# Patient Record
Sex: Male | Born: 1955 | Race: White | Hispanic: No | Marital: Married | Smoking: Never smoker
Health system: Southern US, Community
[De-identification: ages and names within clinical notes are randomized; demographics above are authoritative.]

## PROBLEM LIST (undated history)

## (undated) DIAGNOSIS — M1990 Unspecified osteoarthritis, unspecified site: Secondary | ICD-10-CM

## (undated) DIAGNOSIS — I10 Essential (primary) hypertension: Secondary | ICD-10-CM

## (undated) DIAGNOSIS — M109 Gout, unspecified: Secondary | ICD-10-CM

## (undated) HISTORY — PX: HERNIA REPAIR: SHX51

## (undated) HISTORY — PX: COLONOSCOPY WITH PROPOFOL: SHX5780

---

## 2005-06-20 ENCOUNTER — Emergency Department: Payer: Self-pay | Admitting: Emergency Medicine

## 2005-07-07 ENCOUNTER — Emergency Department: Payer: Self-pay | Admitting: Emergency Medicine

## 2010-07-31 ENCOUNTER — Emergency Department: Payer: Self-pay | Admitting: Internal Medicine

## 2010-11-26 ENCOUNTER — Ambulatory Visit: Payer: Self-pay | Admitting: Family Medicine

## 2011-06-14 ENCOUNTER — Ambulatory Visit: Payer: Self-pay | Admitting: Surgery

## 2016-07-09 ENCOUNTER — Other Ambulatory Visit: Payer: Self-pay

## 2016-07-09 ENCOUNTER — Emergency Department: Payer: BC Managed Care – PPO

## 2016-07-09 ENCOUNTER — Emergency Department
Admission: EM | Admit: 2016-07-09 | Discharge: 2016-07-09 | Disposition: A | Discharge status: Home / Self Care | Payer: BC Managed Care – PPO | Attending: Emergency Medicine | Admitting: Emergency Medicine

## 2016-07-09 DIAGNOSIS — Y939 Activity, unspecified: Secondary | ICD-10-CM | POA: Insufficient documentation

## 2016-07-09 DIAGNOSIS — S51012A Laceration without foreign body of left elbow, initial encounter: Secondary | ICD-10-CM

## 2016-07-09 DIAGNOSIS — S0003XA Contusion of scalp, initial encounter: Secondary | ICD-10-CM | POA: Insufficient documentation

## 2016-07-09 DIAGNOSIS — Y999 Unspecified external cause status: Secondary | ICD-10-CM | POA: Diagnosis not present

## 2016-07-09 DIAGNOSIS — I10 Essential (primary) hypertension: Secondary | ICD-10-CM | POA: Diagnosis not present

## 2016-07-09 DIAGNOSIS — S52122A Displaced fracture of head of left radius, initial encounter for closed fracture: Secondary | ICD-10-CM | POA: Diagnosis not present

## 2016-07-09 DIAGNOSIS — S060X1A Concussion with loss of consciousness of 30 minutes or less, initial encounter: Secondary | ICD-10-CM | POA: Diagnosis not present

## 2016-07-09 DIAGNOSIS — S0990XA Unspecified injury of head, initial encounter: Secondary | ICD-10-CM | POA: Diagnosis present

## 2016-07-09 DIAGNOSIS — Z23 Encounter for immunization: Secondary | ICD-10-CM | POA: Insufficient documentation

## 2016-07-09 DIAGNOSIS — Y929 Unspecified place or not applicable: Secondary | ICD-10-CM | POA: Diagnosis not present

## 2016-07-09 DIAGNOSIS — W1809XA Striking against other object with subsequent fall, initial encounter: Secondary | ICD-10-CM | POA: Diagnosis not present

## 2016-07-09 DIAGNOSIS — W11XXXA Fall on and from ladder, initial encounter: Secondary | ICD-10-CM | POA: Diagnosis not present

## 2016-07-09 DIAGNOSIS — W19XXXA Unspecified fall, initial encounter: Secondary | ICD-10-CM

## 2016-07-09 HISTORY — DX: Essential (primary) hypertension: I10

## 2016-07-09 MED ORDER — LIDOCAINE-EPINEPHRINE 2 %-1:100000 IJ SOLN
20.0000 mL | Freq: Once | INTRAMUSCULAR | Status: DC
Start: 2016-07-09 — End: 2016-07-09
  Filled 2016-07-09: qty 20

## 2016-07-09 MED ORDER — LIDOCAINE-EPINEPHRINE (PF) 1 %-1:200000 IJ SOLN
10.0000 mL | Freq: Once | INTRAMUSCULAR | Status: AC
  Administered 2016-07-09: 10 mL via INTRADERMAL

## 2016-07-09 MED ORDER — LIDOCAINE-EPINEPHRINE (PF) 1 %-1:200000 IJ SOLN
INTRAMUSCULAR | Status: AC
  Administered 2016-07-09: 10 mL via INTRADERMAL
  Filled 2016-07-09: qty 30

## 2016-07-09 MED ORDER — LIDOCAINE HCL (PF) 1 % IJ SOLN
INTRAMUSCULAR | Status: AC
  Filled 2016-07-09: qty 5

## 2016-07-09 MED ORDER — TETANUS-DIPHTH-ACELL PERTUSSIS 5-2.5-18.5 LF-MCG/0.5 IM SUSP
0.5000 mL | Freq: Once | INTRAMUSCULAR | Status: AC
  Administered 2016-07-09: 0.5 mL via INTRAMUSCULAR
  Filled 2016-07-09: qty 0.5

## 2016-07-09 NOTE — ED Provider Notes (Signed)
[facility] Department Provider Note  ____________________________________________  Time seen: Approximately 1:23 PM  I have reviewed the triage vital signs and the nursing notes.   HISTORY  Chief Complaint Fall and Head Injury   HPI Benjamin Parker is a 60 y.o. male with no significant past medical history who presents for evaluation of head injury status post mechanical fall. Patient was 5 feet high on the ladder when his foot got caught and fell backwards. He reports that he hit his head in the occipital region on the concrete. Positive LOC. He does not take any blood thinners. Patient was ambulatory at the scene. Complaining of mild pain in the back of his head. He also hit his left elbow on the concrete and is complaining of moderate pain in the left elbow, worse with movement, present since the fall, nonradiating. Patient denies any other injuries. He was brought in by his son. C-collar was placed in triage.  Past Medical History:  Diagnosis Date  . Hypertension     There are no active problems to display for this patient.   Past Surgical History:  Procedure Laterality Date  . HERNIA REPAIR        Allergies Review of patient's allergies indicates no known allergies.  No family history on file.  Social History Social History  Substance Use Topics  . Smoking status: Never Smoker  . Smokeless tobacco: Never Used  . Alcohol use No    Review of Systems Constitutional: Negative for fever. Eyes: Negative for visual changes. ENT: + head injury Cardiovascular: Negative for chest injury. Respiratory: Negative for shortness of breath. Negative for chest wall injury. Gastrointestinal: Negative for abdominal pain or injury. Genitourinary: Negative for dysuria. Musculoskeletal: Negative for back injury, + left elbow injury Skin: Negative for laceration/abrasions. Neurological: Negative for head  injury.   ____________________________________________   PHYSICAL EXAM:  VITAL SIGNS: ED Triage Vitals  Enc Vitals Group     BP 07/09/16 1151 (!) 179/83     Pulse Rate 07/09/16 1151 (!) 106     Resp 07/09/16 1151 18     Temp 07/09/16 1151 98 F (36.7 C)     Temp Source 07/09/16 1151 Oral     SpO2 07/09/16 1151 97 %     Weight 07/09/16 1151 200 lb (90.7 kg)     Height 07/09/16 1151 5\' 10"  (1.778 m)     Head Circumference --      Peak Flow --      Pain Score 07/09/16 1152 4     Pain Loc --      Pain Edu? --      Excl. in [place]? --     Constitutional: Alert and oriented. No acute distress. Does not appear intoxicated. HEENT Head: Normocephalic and occipital hematoma. Face: No facial bony tenderness. Stable midface Ears: No hemotympanum bilaterally.No hemotympanum no Battle's sign Eyes: No eye injury. PERRL. No raccoon eyes Nose: Nontender. No epistaxis. Mouth/Throat: Mucous membranes are moist. No oropharyngeal blood. No dental injury. Airway patent without stridor. Normal voice. Neck: C-collar in place. No midline c-spine tenderness.  Cardiovascular: Normal rate, regular rhythm. Normal and symmetric distal pulses are present in all extremities. Pulmonary/Chest: Chest wall is stable and nontender to palpation/compression. Normal respiratory effort. Breath sounds are normal. No crepitus.  Abdominal: Soft, nontender, non distended. Musculoskeletal: 2 cm shallow laceration, abrasions and ttp over the L elbow. Nontender with normal full range of motion in all other extremities. No deformities. No thoracic  or lumbar midline spinal tenderness. Pelvis is stable. Skin: Skin is warm, dry and intact. No abrasions or contutions. Psychiatric: Speech and behavior are appropriate. Neurological: Normal speech and language. Moves all extremities to command. No gross focal neurologic deficits are appreciated.  Glascow Coma Score: 4 - Opens eyes on own 6 - Follows simple motor commands 5 -  Alert and oriented GCS: 15   ____________________________________________   LABS (all labs ordered are listed, but only abnormal results are displayed)  Labs Reviewed - No data to display ____________________________________________  EKG  none  ____________________________________________  RADIOLOGY  Head CT: negative CT cervical spine: negative  Elbow XR:  Pre olecranon soft tissue swelling with tiny radiopaque foreign Objects. Osseous irregularity about the radial head, favored to be degenerative, given absence of joint effusion. No well-defined fracture line identified. ____________________________________________   PROCEDURES  Procedure(s) performed:yes  LACERATION REPAIR Performed by: Rudene Re Authorized by: Rudene Re Consent: Verbal consent obtained. Risks and benefits: risks, benefits and alternatives were discussed Consent given by: patient Patient identity confirmed: provided demographic data Prepped and Draped in normal sterile fashion Wound explored  Laceration Location: Left elbow   Laceration Length: 2.5cm  No Foreign Bodies seen or palpated  Anesthesia: local infiltration  Local anesthetic: lidocaine 1% w/ epinephrine  Anesthetic total: 2 ml  Irrigation method: syringe Amount of cleaning: standard  Skin closure: 4.0 prolene  Number of sutures: 3  Technique: simple interrupted  Patient tolerance: Patient tolerated the procedure well with no immediate complications.    SPLINT APPLICATION Date/Time: [id number] PM Authorized by: Rudene Re Consent: Verbal consent obtained. Risks and benefits: risks, benefits and alternatives were discussed Consent given by: patient Splint applied by: orthopedic technician Location details: L forearm Splint type: sugar tongue Supplies used: fiberglass and ACE Post-procedure: The splinted body part was neurovascularly unchanged following the procedure. Patient tolerance: Patient  tolerated the procedure well with no immediate complications.     Critical Care performed:  None ____________________________________________   INITIAL IMPRESSION / ASSESSMENT AND PLAN / ED COURSE  60 year old male with no significant past medical history who presents after falling 5 feet from a ladder with positive LOC. Patient found to have a hematoma on the occipital region, he is neurologically intact, patient also has abrasion and a small superficial laceration to the left elbow with swelling and tenderness. Patient is on a c-collar. Plan for CT head, CT cervical spine, x-ray of the elbow, lac repair, tetanus booster  Clinical Course   _________________________ 3:04 PM on 07/09/2016 -----------------------------------------  Head CT and CT cervical spine negative. X-ray showing an osseous irregularity on the radial head, patient is tender to palpation there. Therefore splint was placed and patient will follow-up with ortho. Laceration repair. XR also showing foreign body, no foreign body seen on eval, patient had no laceration and skin was intact over the area of the foreign body. Tetanus shot Was given. Patient will f/u with Dr. Roland Rack in 1 week.   Pertinent labs & imaging results that were available during my care of the patient were reviewed by me and considered in my medical decision making (see chart for details).   I discussed my evaluation of the patient's symptoms, my clinical impression, and my proposed outpatient treatment plan with patient/ family members. We have discussed anticipatory guidance, scheduled follow-up, and careful return precautions. The patient expresses understanding and is comfortable with the discharge plan. All patient's questions were answered.    ____________________________________________   FINAL CLINICAL IMPRESSION(S) / ED DIAGNOSES  Final diagnoses:  Fall, initial encounter  Left radial head fracture, closed, initial encounter  Concussion,  with loss of consciousness of 30 minutes or less, initial encounter  Elbow laceration, left, initial encounter  Scalp hematoma, initial encounter      NEW MEDICATIONS STARTED DURING THIS VISIT:  New Prescriptions   No medications on file     Note:  This document was prepared using Dragon voice recognition software and may include unintentional dictation errors.    Rudene Re, MD 07/09/16 1520

## 2016-07-09 NOTE — ED Notes (Signed)
MD at bedside for suture placement. Pt in NAD.

## 2016-07-09 NOTE — ED Triage Notes (Signed)
Pt states he was approximately 46ft up on the ladder and slipped and fell back hitting his head on brick knocking him unconscious , states when he woke up it was not able to move for a minute or 2, felt paralyzed.. Pt has large hematoma to the back of the head and is c/o pain to the left elbow., c-collar applied.Marland Kitchen

## 2016-07-09 NOTE — Discharge Instructions (Signed)
Follow up with ortho in 1 week. Keep splint dry and clean. Watch laceration for signs of infection including fever, pus, or redness of the skin. If those develop, pls return to the ER.

## 2017-04-02 ENCOUNTER — Encounter: Payer: Self-pay | Admitting: Podiatry

## 2017-04-02 ENCOUNTER — Ambulatory Visit (INDEPENDENT_AMBULATORY_CARE_PROVIDER_SITE_OTHER): Payer: BC Managed Care – PPO | Admitting: Podiatry

## 2017-04-02 DIAGNOSIS — M722 Plantar fascial fibromatosis: Secondary | ICD-10-CM | POA: Diagnosis not present

## 2017-04-02 MED ORDER — MELOXICAM 15 MG PO TABS: 15.0000 mg | ORAL_TABLET | Freq: Every day | ORAL | 3 refills | Status: DC

## 2017-04-02 MED ORDER — METHYLPREDNISOLONE 4 MG PO TBPK: ORAL_TABLET | ORAL | 0 refills | Status: DC

## 2017-04-02 NOTE — Progress Notes (Signed)
   Subjective:    Patient ID: Benjamin Parker, male    DOB: 1956/09/14, 61 y.o.   MRN: [id number]  HPI: He presents is that he is a 60 year old white male with chief complaint of bilateral heel pain and dorsum of the foot pain. States is been going on now right worse than left or the past several months. States that he's tried new shoes and boots to no avail.    Review of Systems  All other systems reviewed and are negative.      Objective:   Physical Exam: Vital signs are stable alert and oriented 3. Pulses are palpable. Neurological sensory is intact. Degenerative flexors are intact. Muscle strength is normal. Orthopedic evaluation demonstrates all joints distal ankle range of motion without crepitation. He has pain on palpation medial calcaneal tubercle bilateral and pain on palpation of the deep peroneal nerve to the dorsal aspect of the right foot over the left. Radiographs were not taken today.        Assessment & Plan:  Assessment plantar fasciitis bilateral right greater than left deep peroneal nerve neuritis.  Plan: Discussed etiology pathology conservative versus surgical therapies. Started him on a Medrol Dosepak to be followed by meloxicam. Injected bilateral heels. Placed him in bilateral plantar fascia braces and single night splint. Discussed appropriate shoe gear stretching exercises ice therapy and shoe modification. Follow-up with me in 1 month

## 2017-04-02 NOTE — Patient Instructions (Signed)

## 2017-04-30 ENCOUNTER — Encounter: Payer: Self-pay | Admitting: Podiatry

## 2017-04-30 ENCOUNTER — Ambulatory Visit: Payer: BC Managed Care – PPO | Admitting: Podiatry

## 2017-04-30 ENCOUNTER — Ambulatory Visit (INDEPENDENT_AMBULATORY_CARE_PROVIDER_SITE_OTHER): Payer: BC Managed Care – PPO | Admitting: Podiatry

## 2017-04-30 DIAGNOSIS — M722 Plantar fascial fibromatosis: Secondary | ICD-10-CM

## 2017-04-30 NOTE — Progress Notes (Signed)
Benjamin Parker presents today states that he is 90-95% better as he refers and plantar fasciitis bilateral foot.  Objective: Vital signs are stable he is alert and oriented 3. Mild tender to palpation medial tubercle of the left heel.  Assessment: Well-healing plantar fasciitis 95% resolved.  Plan: I highly recommend he continue all conservative therapies for at least another month if this should fail restart regresses and fossa not immediately for another injection as well as orthotics.

## 2017-05-23 ENCOUNTER — Other Ambulatory Visit: Payer: Self-pay

## 2017-05-23 ENCOUNTER — Observation Stay
Admission: EM | Admit: 2017-05-23 | Discharge: 2017-05-24 | Disposition: A | Discharge status: Home / Self Care | Payer: BC Managed Care – PPO | Attending: Internal Medicine | Admitting: Internal Medicine

## 2017-05-23 ENCOUNTER — Emergency Department: Payer: BC Managed Care – PPO

## 2017-05-23 DIAGNOSIS — E860 Dehydration: Secondary | ICD-10-CM | POA: Insufficient documentation

## 2017-05-23 DIAGNOSIS — H539 Unspecified visual disturbance: Secondary | ICD-10-CM | POA: Diagnosis present

## 2017-05-23 DIAGNOSIS — I160 Hypertensive urgency: Secondary | ICD-10-CM

## 2017-05-23 DIAGNOSIS — Z79899 Other long term (current) drug therapy: Secondary | ICD-10-CM | POA: Diagnosis not present

## 2017-05-23 DIAGNOSIS — G459 Transient cerebral ischemic attack, unspecified: Secondary | ICD-10-CM

## 2017-05-23 DIAGNOSIS — N179 Acute kidney failure, unspecified: Secondary | ICD-10-CM | POA: Diagnosis not present

## 2017-05-23 DIAGNOSIS — G43B0 Ophthalmoplegic migraine, not intractable: Principal | ICD-10-CM | POA: Insufficient documentation

## 2017-05-23 DIAGNOSIS — Z791 Long term (current) use of non-steroidal anti-inflammatories (NSAID): Secondary | ICD-10-CM | POA: Diagnosis not present

## 2017-05-23 DIAGNOSIS — M542 Cervicalgia: Secondary | ICD-10-CM | POA: Insufficient documentation

## 2017-05-23 DIAGNOSIS — I10 Essential (primary) hypertension: Secondary | ICD-10-CM | POA: Insufficient documentation

## 2017-05-23 DIAGNOSIS — R413 Other amnesia: Secondary | ICD-10-CM

## 2017-05-23 LAB — URINALYSIS, COMPLETE (UACMP) WITH MICROSCOPIC
Bacteria, UA: NONE SEEN
Bilirubin Urine: NEGATIVE
GLUCOSE, UA: 50 mg/dL — AB
Hgb urine dipstick: NEGATIVE
KETONES UR: NEGATIVE mg/dL
Leukocytes, UA: NEGATIVE
Nitrite: NEGATIVE
PH: 7 (ref 5.0–8.0)
Protein, ur: NEGATIVE mg/dL
SPECIFIC GRAVITY, URINE: 1.008 (ref 1.005–1.030)
SQUAMOUS EPITHELIAL / LPF: NONE SEEN

## 2017-05-23 LAB — CBC
HEMATOCRIT: 39.7 % — AB (ref 40.0–52.0)
Hemoglobin: 13.6 g/dL (ref 13.0–18.0)
MCH: 29.2 pg (ref 26.0–34.0)
MCHC: 34.3 g/dL (ref 32.0–36.0)
MCV: 85 fL (ref 80.0–100.0)
Platelets: 223 10*3/uL (ref 150–440)
RBC: 4.67 MIL/uL (ref 4.40–5.90)
RDW: 13.3 % (ref 11.5–14.5)
WBC: 6.8 10*3/uL (ref 3.8–10.6)

## 2017-05-23 LAB — COMPREHENSIVE METABOLIC PANEL
ALT: 15 U/L — ABNORMAL LOW (ref 17–63)
AST: 24 U/L (ref 15–41)
Albumin: 4.2 g/dL (ref 3.5–5.0)
Alkaline Phosphatase: 52 U/L (ref 38–126)
Anion gap: 6 (ref 5–15)
BILIRUBIN TOTAL: 0.8 mg/dL (ref 0.3–1.2)
BUN: 14 mg/dL (ref 6–20)
CHLORIDE: 102 mmol/L (ref 101–111)
CO2: 28 mmol/L (ref 22–32)
CREATININE: 1.41 mg/dL — AB (ref 0.61–1.24)
Calcium: 8.7 mg/dL — ABNORMAL LOW (ref 8.9–10.3)
GFR, EST NON AFRICAN AMERICAN: 52 mL/min — AB (ref >60)
Glucose, Bld: 143 mg/dL — ABNORMAL HIGH (ref 65–99)
POTASSIUM: 3.8 mmol/L (ref 3.5–5.1)
Sodium: 136 mmol/L (ref 135–145)
TOTAL PROTEIN: 7.3 g/dL (ref 6.5–8.1)

## 2017-05-23 LAB — TROPONIN I

## 2017-05-23 MED ORDER — ASPIRIN 81 MG PO CHEW
324.0000 mg | CHEWABLE_TABLET | Freq: Once | ORAL | Status: AC
  Administered 2017-05-23: 324 mg via ORAL
  Filled 2017-05-23: qty 4

## 2017-05-23 MED ORDER — LABETALOL HCL 5 MG/ML IV SOLN
10.0000 mg | Freq: Once | INTRAVENOUS | Status: AC
  Administered 2017-05-23: 10 mg via INTRAVENOUS
  Filled 2017-05-23: qty 4

## 2017-05-23 NOTE — H&P (Signed)
[place] @ [facility] Admission History and Physical Harvie Bridge, D.O.  ---------------------------------------------------------------------------------------------------------------------   PATIENT NAME: Benjamin Parker MR#: [id number] DATE OF BIRTH: [birth year] DATE OF ADMISSION: 05/23/2017 PRIMARY CARE PHYSICIAN: Albina Billet, MD  REQUESTING/REFERRING PHYSICIAN: ED Dr. Jimmye Norman  CHIEF COMPLAINT: Chief Complaint  Patient presents with  . Loss of Vision  . Neck Pain  . Memory Loss    HISTORY OF PRESENT ILLNESS: Benjamin Parker is a 61 y.o. male with a known history of HTN was in a usual state of health until today around 6:30PM when he had sudden onset of memory loss where he could not remember people's names. He also had vision changes including flashing lights and floaters.  His symptoms have fully resolved. He states that he has had a minor cough recently as well as chronic allergic eye irritation.  Otherwise there has been no change in status. Patient has been taking medication as prescribed and there has been no recent change in medication or diet.  There has been no recent illness, travel or sick contacts.    Patient denies fevers/chills, weakness, dizziness, chest pain, shortness of breath, N/V/C/D, abdominal pain, dysuria/frequency, changes in mental status.   EMS/ED COURSE:  Patient received aspirin 324 and labetalol 10 mg IV. He was seen by telemetry neurology and medical admission was requested for further workup of transient ischemic attack.   PAST MEDICAL HISTORY: Past Medical History:  Diagnosis Date  . Hypertension   Seasonal allergies    PAST SURGICAL HISTORY: Past Surgical History:  Procedure Laterality Date  . HERNIA REPAIR        SOCIAL HISTORY: Social History  Substance Use Topics  . Smoking status: Never Smoker  . Smokeless tobacco: Never Used  . Alcohol use No  Denies drug, alcohol tobacco use. Works as a Games developer.   Family  History   Medical History Relation Name Comments  Pancreatic cancer Father    Other Mother  Brain Tumor      MEDICATIONS AT HOME: Prior to Admission medications   Medication Sig Start Date End Date Taking? Authorizing Provider  lisinopril (PRINIVIL,ZESTRIL) 10 MG tablet  02/14/17  Yes [provider]  meloxicam (MOBIC) 15 MG tablet Take 1 tablet (15 mg total) by mouth daily. 04/02/17  Yes Hyatt, Max T, DPM      DRUG ALLERGIES: No Known Allergies   REVIEW OF SYSTEMS: CONSTITUTIONAL: No fever/chills, fatigue, weakness, weight gain/loss, headache EYES: Positive blurry or double vision, flashing lights, floaters. ENT: No tinnitus, postnasal drip, redness or soreness of the oropharynx. RESPIRATORY: No cough, wheeze, hemoptysis, dyspnea. CARDIOVASCULAR: No chest pain, orthopnea, palpitations, syncope. GASTROINTESTINAL: No nausea, vomiting, constipation, diarrhea, abdominal pain, hematemesis, melena or hematochezia. GENITOURINARY: No dysuria or hematuria. ENDOCRINE: No polyuria or nocturia. No heat or cold intolerance. HEMATOLOGY: No anemia, bruising, bleeding. INTEGUMENTARY: No rashes, ulcers, lesions. MUSCULOSKELETAL: No arthritis, swelling, gout. NEUROLOGIC: Positive memory loss. No numbness, tingling, weakness or ataxia. No seizure-type activity. PSYCHIATRIC: No anxiety, depression, insomnia.  PHYSICAL EXAMINATION: VITAL SIGNS: Blood pressure (!) 214/92, pulse 69, temperature 97.7 F (36.5 C), temperature source Oral, resp. rate 16, height 5\' 10"  (1.778 m), weight 94.8 kg (209 lb), SpO2 97 %.  GENERAL: 61 y.o.-year-old male patient, well-developed, well-nourished lying in the bed in no acute distress.  Pleasant and cooperative.   HEENT: Head atraumatic, normocephalic. Pupils equal, round, reactive to light and accommodation.Injected sclerae bilaterally Extraocular muscles intact. Nares are patent. Oropharynx is clear. Mucus membranes moist. NECK: Supple, full range of  motion. No JVD, no bruit heard. No thyroid enlargement, no tenderness, no cervical lymphadenopathy. CHEST: Normal breath sounds bilaterally. No wheezing, rales, rhonchi or crackles. No use of accessory muscles of respiration.  No reproducible chest wall tenderness.  CARDIOVASCULAR: S1, S2 normal. No murmurs, rubs, or gallops. Cap refill <2 seconds. ABDOMEN: Soft, nontender, nondistended. No rebound, guarding, rigidity. Normoactive bowel sounds present in all four quadrants. No organomegaly or mass. EXTREMITIES: Full range of motion. No pedal edema, cyanosis, or clubbing. NEUROLOGIC: Cranial nerves II through XII are grossly intact with no focal sensorimotor deficit. Muscle strength 5/5 in all extremities. Sensation intact. Gait not checked. Cerebellar signs intact PSYCHIATRIC: The patient is alert and oriented x 3. Normal affect, mood, thought content. SKIN: Warm, dry, and intact without obvious rash, lesion, or ulcer.  LABORATORY PANEL:  CBC  Recent Labs Lab 05/23/17 1952  WBC 6.8  HGB 13.6  HCT 39.7*  PLT 223   ----------------------------------------------------------------------------------------------------------------- Chemistries  Recent Labs Lab 05/23/17 1952  NA 136  K 3.8  CL 102  CO2 28  GLUCOSE 143*  BUN 14  CREATININE 1.41*  CALCIUM 8.7*  AST 24  ALT 15*  ALKPHOS 52  BILITOT 0.8   ------------------------------------------------------------------------------------------------------------------ Cardiac Enzymes  Recent Labs Lab 05/23/17 1952  TROPONINI <0.03   ------------------------------------------------------------------------------------------------------------------  RADIOLOGY: Ct Head Wo Contrast  Result Date: 05/23/2017 CLINICAL DATA:  Blind spots, vision changes EXAM: CT HEAD WITHOUT CONTRAST TECHNIQUE: Contiguous axial images were obtained from the base of the skull through the vertex without intravenous contrast. COMPARISON:  07/09/2016  FINDINGS: Brain: No evidence of acute infarction, hemorrhage, hydrocephalus, extra-axial collection or mass lesion/mass effect. Vascular: No hyperdense vessel or unexpected calcification. Skull: Normal. Negative for fracture or focal lesion. Sinuses/Orbits: Mild mucosal thickening in the ethmoid sinuses. No acute orbital abnormality. Other: None IMPRESSION: No CT evidence for acute intracranial abnormality. Electronically Signed   By: Donavan Foil M.D.   On: 05/23/2017 20:15    EKG: Normal sinus rhythm at 70 bpm, normal axis, nonspecific ST and T wave changes.  IMPRESSION AND PLAN:  This is a 61 y.o. male with a history of hypertension now being admitted with:  1. TIA rule out CVA -  - Admit telemetry observation for neuro workup including: - Studies: MRA/MRI, Echo, Carotids - Labs: CBC, BMP, Lipids, TFTs, A1C - Nursing: Neurochecks, O2, dysphagia screen, permissive hypertension (hold lisinopril). - Consults: Neurology, PT/OT, S/S consults.  - Meds: Daily aspirin 81mg .   - Fluids: IVNS@75cc /hr.   -   2. Renal insufficiency unclear if acute or chronic -Gentle IV fluid hydration -Repeat BMP in a.m. -Hold lisinopril and low back  Admission status: Observation, telemetry IV fluids: Normal saline Diet: Nothing by mouth pending swallowing eval Routine DVT Px: with Lovenox, SCDs, early ambulation Consults: Neurology Code Status: Full Disposition plan: To home in less than 24 hours  All the records are reviewed and case discussed with ED provider. Management plans discussed with the patient and/or family who express understanding and agree with plan of care.   TOTAL TIME TAKING CARE OF THIS PATIENT: 60 minutes.   Orvilla Truett D.O. on 05/23/2017 at 11:31 PM Between 7am to 6pm - Pager - [phone] After 6pm go to [email or web address] - [employer]reader [facility] Office [phone] CC: Primary care physician; Albina Billet, MD     Note: This  dictation was prepared with Dragon dictation along with smaller phrase technology. Any transcriptional errors that result from this process are unintentional.

## 2017-05-23 NOTE — ED Provider Notes (Signed)
[facility] Emergency Department Provider Note       Time seen: ----------------------------------------- 10:03 PM on 05/23/2017 -----------------------------------------     I have reviewed the triage vital signs and the nursing notes.   HISTORY   Chief Complaint Loss of Vision; Neck Pain; and Memory Loss    HPI Benjamin Parker is a 61 y.o. male who presents to the ED for episodes of blind spots and flashes on his vision that occurred an hour and a half ago. Patient states he had an episode today where he couldn't remember names. He reports soreness and tightness to both shoulders and his neck. He denies any trouble speaking or swallowing. Similar symptoms have occurred on Monday or Tuesday but have since resolved. He denies any other neurologic complaints or symptoms at this time.   Past Medical History:  Diagnosis Date  . Hypertension     There are no active problems to display for this patient.   Past Surgical History:  Procedure Laterality Date  . HERNIA REPAIR      Allergies Patient has no known allergies.  Social History Social History  Substance Use Topics  . Smoking status: Never Smoker  . Smokeless tobacco: Never Used  . Alcohol use No    Review of Systems Constitutional: Negative for fever. Eyes: Positive for vision changes ENT:  Negative for congestion, sore throat Cardiovascular: Negative for chest pain. Respiratory: Negative for shortness of breath. Gastrointestinal: Negative for abdominal pain, vomiting and diarrhea. Genitourinary: Negative for dysuria. Musculoskeletal: Negative for back pain. Skin: Negative for rash. Neurological: Negative for headaches, focal weakness or numbness. Positive for difficulty with memory recall  All systems negative/normal/unremarkable except as stated in the HPI  ____________________________________________   PHYSICAL EXAM:  VITAL SIGNS: ED Triage Vitals  Enc Vitals Group      BP 05/23/17 1949 (!) 197/94     Pulse Rate 05/23/17 1949 93     Resp 05/23/17 1949 18     Temp 05/23/17 1949 97.7 F (36.5 C)     Temp Source 05/23/17 1949 Oral     SpO2 05/23/17 1949 98 %     Weight 05/23/17 1944 209 lb (94.8 kg)     Height 05/23/17 1944 5\' 10"  (1.778 m)     Head Circumference --      Peak Flow --      Pain Score 05/23/17 1944 5     Pain Loc --      Pain Edu? --      Excl. in [place]? --    Constitutional: Alert and oriented. Well appearing and in no distress. Eyes: Conjunctivae are normal. Normal extraocular movements.Funduscopic exam reveals normal optic discs bilaterally ENT   Head: Normocephalic and atraumatic.   Nose: No congestion/rhinnorhea.   Mouth/Throat: Mucous membranes are moist.   Neck: No stridor. Cardiovascular: Normal rate, regular rhythm. No murmurs, rubs, or gallops. Respiratory: Normal respiratory effort without tachypnea nor retractions. Breath sounds are clear and equal bilaterally. No wheezes/rales/rhonchi. Gastrointestinal: Soft and nontender. Normal bowel sounds Musculoskeletal: Nontender with normal range of motion in extremities. No lower extremity tenderness nor edema. Neurologic:  Normal speech and language. No gross focal neurologic deficits are appreciated.  Skin:  Skin is warm, dry and intact. No rash noted. Psychiatric: Mood and affect are normal. Speech and behavior are normal.  ____________________________________________  EKG: Interpreted by me.Sinus rhythm rate of 70 bpm, normal PR interval, normal QRS, normal QT.  ____________________________________________  ED COURSE:  Pertinent labs & imaging  results that were available during my care of the patient were reviewed by me and considered in my medical decision making (see chart for details). Patient presents for seeing flashes and floaters as well as transient memory recall problems, we will assess with labs and imaging as indicated.    Procedures ____________________________________________   LABS (pertinent positives/negatives)  Labs Reviewed  CBC - Abnormal; Notable for the following:       Result Value   HCT 39.7 (*)    All other components within normal limits  COMPREHENSIVE METABOLIC PANEL - Abnormal; Notable for the following:    Glucose, Bld 143 (*)    Creatinine, Ser 1.41 (*)    Calcium 8.7 (*)    ALT 15 (*)    GFR calc non Af Amer 52 (*)    All other components within normal limits  URINALYSIS, COMPLETE (UACMP) WITH MICROSCOPIC - Abnormal; Notable for the following:    Color, Urine STRAW (*)    APPearance CLEAR (*)    Glucose, UA 50 (*)    All other components within normal limits  TROPONIN I    RADIOLOGY CT head  IMPRESSION: No CT evidence for acute intracranial abnormality.   ____________________________________________  FINAL ASSESSMENT AND PLAN  Visual disturbance, memory disturbance, hypertension  Plan: Patient's labs and imaging were dictated above. Patient had presented for seeing flashes and floaters and having trouble recalling names. Otherwise his examination is normal in all the symptoms have subsided. We will consult neurology for further evaluation.  Patient was discussed with patella neurology on call recommends admission with possible MRI and blood pressure control. We have given him aspirin, I will start him on labetalol and talk with the hospitalist for admission. Earleen Newport, MD   Note: This note was generated in part or whole with voice recognition software. Voice recognition is usually quite accurate but there are transcription errors that can and very often do occur. I apologize for any typographical errors that were not detected and corrected.     Earleen Newport, MD 05/23/17 [phone]

## 2017-05-23 NOTE — ED Triage Notes (Addendum)
Pt presents to ED with c/o episodes of "blind spots" and flashes on his vision that occurred 1.5 hrs PTA. Pt also states that he had an episode today where he "couldn't remember names". Pt reports soreness and tightness to bilateral shoulders and neck. Pt denies trouble speaking or swallowing, no facial droop, no c/o unilateral numbness or weakness in the extremities. Pt reports similar s/x's occurred on "Monday or Tuesday" that have since resolved. Pt is HYPERTENSIVE in Triage (BP 197/94); reports compliance with BP meds.

## 2017-05-24 ENCOUNTER — Observation Stay (HOSPITAL_COMMUNITY): Payer: BC Managed Care – PPO

## 2017-05-24 ENCOUNTER — Observation Stay: Payer: BC Managed Care – PPO

## 2017-05-24 ENCOUNTER — Observation Stay
Admit: 2017-05-24 | Discharge: 2017-05-24 | Disposition: A | Discharge status: Home / Self Care | Payer: BC Managed Care – PPO | Attending: Family Medicine | Admitting: Family Medicine

## 2017-05-24 DIAGNOSIS — G43109 Migraine with aura, not intractable, without status migrainosus: Secondary | ICD-10-CM | POA: Diagnosis not present

## 2017-05-24 LAB — LIPID PANEL
CHOLESTEROL: 204 mg/dL — AB (ref 0–200)
HDL: 29 mg/dL — ABNORMAL LOW (ref >40)
LDL Cholesterol: 150 mg/dL — ABNORMAL HIGH (ref 0–99)
Total CHOL/HDL Ratio: 7 RATIO
Triglycerides: 127 mg/dL (ref <150)
VLDL: 25 mg/dL (ref 0–40)

## 2017-05-24 LAB — URINE DRUG SCREEN, QUALITATIVE (ARMC ONLY)
AMPHETAMINES, UR SCREEN: NOT DETECTED
BARBITURATES, UR SCREEN: NOT DETECTED
BENZODIAZEPINE, UR SCRN: NOT DETECTED
Cannabinoid 50 Ng, Ur ~~LOC~~: NOT DETECTED
Cocaine Metabolite,Ur ~~LOC~~: NOT DETECTED
MDMA (Ecstasy)Ur Screen: NOT DETECTED
Methadone Scn, Ur: NOT DETECTED
OPIATE, UR SCREEN: NOT DETECTED
PHENCYCLIDINE (PCP) UR S: NOT DETECTED
Tricyclic, Ur Screen: NOT DETECTED

## 2017-05-24 LAB — ECHOCARDIOGRAM COMPLETE
HEIGHTINCHES: 70 in
WEIGHTICAEL: 3344 [oz_av]

## 2017-05-24 LAB — TROPONIN I

## 2017-05-24 MED ORDER — STROKE: EARLY STAGES OF RECOVERY BOOK
Freq: Once | Status: AC
  Administered 2017-05-24: 01:00:00

## 2017-05-24 MED ORDER — ACETAMINOPHEN 650 MG RE SUPP: 650.0000 mg | RECTAL | Status: DC | PRN

## 2017-05-24 MED ORDER — ACETAMINOPHEN 325 MG PO TABS: 650.0000 mg | ORAL_TABLET | ORAL | Status: DC | PRN

## 2017-05-24 MED ORDER — ASPIRIN EC 81 MG PO TBEC
81.0000 mg | DELAYED_RELEASE_TABLET | Freq: Every day | ORAL | Status: DC
  Administered 2017-05-24: 81 mg via ORAL
  Filled 2017-05-24: qty 1

## 2017-05-24 MED ORDER — ASPIRIN 81 MG PO TBEC: 81.0000 mg | DELAYED_RELEASE_TABLET | Freq: Every day | ORAL | 0 refills | Status: AC

## 2017-05-24 MED ORDER — SENNOSIDES-DOCUSATE SODIUM 8.6-50 MG PO TABS: 1.0000 | ORAL_TABLET | Freq: Every evening | ORAL | Status: DC | PRN

## 2017-05-24 MED ORDER — ENOXAPARIN SODIUM 40 MG/0.4ML ~~LOC~~ SOLN
40.0000 mg | SUBCUTANEOUS | Status: DC
  Filled 2017-05-24: qty 0.4

## 2017-05-24 MED ORDER — ACETAMINOPHEN 160 MG/5ML PO SOLN: 650.0000 mg | ORAL | Status: DC | PRN

## 2017-05-24 MED ORDER — SODIUM CHLORIDE 0.9 % IV SOLN
INTRAVENOUS | Status: DC
  Administered 2017-05-24: 02:00:00 via INTRAVENOUS

## 2017-05-24 NOTE — Progress Notes (Signed)
Patient is to be discharged home today. Patient is in no acute distress at this time, and assessment is unchanged from this morning. Patient's IV is out, discharge paperwork has been discussed with patient/family, home medications have been returned to family from pharmacy, and there are no questions or concerns at this time. Patient will be accompanied downstairs by staff and family via wheelchair.

## 2017-05-24 NOTE — Evaluation (Addendum)
Occupational Therapy Evaluation Patient Details Name: Benjamin Parker MRN: [id number] DOB: 12/06/1956 Today's Date: 05/24/2017    History of Present Illness Pt is a 61 y.o. male with a known history of HTN, in a usual state of health until today around 6:30PM 05/23/17, when he had sudden onset of memory loss where he could not remember people's names. He also had vision changes including flashing lights and floaters.  His symptoms have fully resolved.    Clinical Impression   Pt seen for OT evaluation after speaking with MD to clear bed rest orders. Pt very active and independent at baseline and eager to return home at [facility]. Pt reports all symptoms have resolved. No deficits noted with visual testing, balance testing, cognitive testing, and able to perform all self care tasks and functional mobility at baseline independence with good confidence and no LOB. No OT follow up recommended at this time. Will sign off. Please re-consult should additional OT needs arise.     Follow Up Recommendations  No OT follow up    Equipment Recommendations  None recommended by OT    Recommendations for Other Services       Precautions / Restrictions Precautions Precautions: None Restrictions Weight Bearing Restrictions: No      Mobility Bed Mobility Overal bed mobility: Independent             General bed mobility comments: steady, good confidence, no LOB  Transfers Overall transfer level: Independent               General transfer comment: steady, good confidence, no LOB    Balance Overall balance assessment: Independent                                         ADL either performed or assessed with clinical judgement   ADL Overall ADL's : Independent;At baseline                                       General ADL Comments: pt able to perform all aspects of bed mobility, toileting, dressing, grooming independently at baseline level with good  safety awareness, no LOB     Vision Baseline Vision/History: Wears glasses Wears Glasses: Reading only Patient Visual Report: No change from baseline (initial visual symptoms all resolved per pt report) Vision Assessment?: No apparent visual deficits;Yes Eye Alignment: Within Functional Limits Ocular Range of Motion: Within Functional Limits Alignment/Gaze Preference: Within Defined Limits Tracking/Visual Pursuits: Able to track stimulus in all quads without difficulty Saccades: Within functional limits Convergence: Within functional limits Visual Fields: No apparent deficits     Perception     Praxis      Pertinent Vitals/Pain Pain Assessment: No/denies pain     Hand Dominance Right   Extremity/Trunk Assessment Upper Extremity Assessment Upper Extremity Assessment: Overall WFL for tasks assessed   Lower Extremity Assessment Lower Extremity Assessment: Overall WFL for tasks assessed   Cervical / Trunk Assessment Cervical / Trunk Assessment: Normal   Communication Communication Communication: No difficulties   Cognition Arousal/Alertness: Awake/alert Behavior During Therapy: WFL for tasks assessed/performed Overall Cognitive Status: Within Functional Limits for tasks assessed  General Comments: A&Ox4, follows commands, good safety awareness   General Comments       Exercises     Shoulder Instructions      Home Living Family/patient expects to be discharged to:: Private residence Living Arrangements: Spouse/significant other;Children Available Help at Discharge: Family;Available 24 hours/day;Available PRN/intermittently Type of Home: House Home Access: Stairs to enter [employer] of Steps: 2 Entrance Stairs-Rails: Can reach both;Right;Left Home Layout: One level     Bathroom Shower/Tub: Corporate investment banker: Standard     Home Equipment: None          Prior  Functioning/Environment Level of Independence: Independent        Comments: indep with ADL, IADL including driving, working full time Nurse, children's, 1 fall in past 12 months (work related)        OT Problem List:        OT Treatment/Interventions:      OT Goals(Current goals can be found in the care plan section) Acute Rehab OT Goals Patient Stated Goal: go home OT Goal Formulation: All assessment and education complete, DC therapy  OT Frequency:     Barriers to D/C:            Co-evaluation              AM-PAC PT "6 Clicks" Daily Activity     Outcome Measure Help from another person eating meals?: None Help from another person taking care of personal grooming?: None Help from another person toileting, which includes using toliet, bedpan, or urinal?: None Help from another person bathing (including washing, rinsing, drying)?: None Help from another person to put on and taking off regular upper body clothing?: None Help from another person to put on and taking off regular lower body clothing?: None 6 Click Score: 24   End of Session    Activity Tolerance: Patient tolerated treatment well Patient left: in bed;with call bell/phone within reach;with family/visitor present (SCDs in place)  OT Visit Diagnosis: Other symptoms and signs involving cognitive function                Time: 0900-0909 OT Time Calculation (min): 9 min Charges:  OT General Charges $OT Visit: 1 Procedure OT Evaluation $OT Eval Low Complexity: 1 Procedure G-Codes: OT G-codes **NOT FOR INPATIENT CLASS** Functional Assessment Tool Used: Clinical judgement;AM-PAC 6 Clicks Daily Activity Functional Limitation: Self care Self Care Current Status (C1638): 0 percent impaired, limited or restricted Self Care Goal Status (G5364): 0 percent impaired, limited or restricted Self Care Discharge Status (W8032): 0 percent impaired, limited or restricted   Jeni Salles, MPH, MS, OTR/L ascom  [phone] 05/24/17, 9:25 AM

## 2017-05-24 NOTE — Consult Note (Signed)
Referring Physician: Leslye Peer    Chief Complaint: Visual changes  HPI: Benjamin Parker is an 61 y.o. male who reports that on yesterday he had the acute onset of floaters coming from his peripheral vision that were bright and yellow.  Patient then had a sensation of lights flashing.  He later noted that he was unable to recall names.  This lasted for a few minutes then resolved.  The patient was concerned about the memory issues and presented for evaluation.  Patient reports that he has had similar episodes in the past and was told they were ocular migraines.  Has developed a headache behind the eyes today.  Initial NIHSS of 0.  Date last known well: Date: 05/23/2017 Time last known well: Time: 18:30 tPA Given: No: Resolution of symptoms  Past Medical History:  Diagnosis Date  . Hypertension     Past Surgical History:  Procedure Laterality Date  . HERNIA REPAIR      Family history: Parents deceased.  Mother with brain tumor and father with pancreatic cancer  Social History:  reports that he has never smoked. He has never used smokeless tobacco. He reports that he does not drink alcohol or use drugs.  Allergies: No Known Allergies  Medications:  I have reviewed the patient's current medications. Prior to Admission:  No prescriptions prior to admission.    ROS: History obtained from the patient  General ROS: negative for - chills, fatigue, fever, night sweats, weight gain or weight loss Psychological ROS: negative for - behavioral disorder, hallucinations, memory difficulties, mood swings or suicidal ideation Ophthalmic ROS: as noted in HPI ENT ROS: negative for - epistaxis, nasal discharge, oral lesions, sore throat, tinnitus or vertigo Allergy and Immunology ROS: negative for - hives or itchy/watery eyes Hematological and Lymphatic ROS: negative for - bleeding problems, bruising or swollen lymph nodes Endocrine ROS: negative for - galactorrhea, hair pattern changes,  polydipsia/polyuria or temperature intolerance Respiratory ROS: negative for - cough, hemoptysis, shortness of breath or wheezing Cardiovascular ROS: negative for - chest pain, dyspnea on exertion, edema or irregular heartbeat Gastrointestinal ROS: negative for - abdominal pain, diarrhea, hematemesis, nausea/vomiting or stool incontinence Genito-Urinary ROS: negative for - dysuria, hematuria, incontinence or urinary frequency/urgency Musculoskeletal ROS: plantar fasciitis Neurological ROS: as noted in HPI Dermatological ROS: negative for rash and skin lesion changes  Physical Examination: Blood pressure (!) 147/73, pulse 75, temperature 98.1 F (36.7 C), temperature source Oral, resp. rate 18, height 5\' 10"  (1.778 m), weight 94.8 kg (209 lb), SpO2 95 %.  HEENT-  Normocephalic, no lesions, without obvious abnormality.  Normal external eye and conjunctiva.  Normal TM's bilaterally.  Normal auditory canals and external ears. Normal external nose, mucus membranes and septum.  Normal pharynx. Cardiovascular- S1, S2 normal, pulses palpable throughout   Lungs- chest clear, no wheezing, rales, normal symmetric air entry Abdomen- soft, non-tender; bowel sounds normal; no masses,  no organomegaly Extremities- no edema Lymph-no adenopathy palpable Musculoskeletal-no joint tenderness, deformity or swelling Skin-warm and dry, no hyperpigmentation, vitiligo, or suspicious lesions  Neurological Examination   Mental Status: Alert, oriented, thought content appropriate.  Speech fluent without evidence of aphasia.  Able to follow 3 step commands without difficulty. Cranial Nerves: II: Discs flat bilaterally; Visual fields grossly normal, pupils equal, round, reactive to light and accommodation III,IV, VI: ptosis not present, extra-ocular motions intact bilaterally V,VII: smile symmetric, facial light touch sensation normal bilaterally VIII: hearing normal bilaterally IX,X: gag reflex present XI:  bilateral shoulder shrug XII: midline tongue  extension Motor: Right : Upper extremity   5/5    Left:     Upper extremity   5/5  Lower extremity   5/5     Lower extremity   5/5 Tone and bulk:normal tone throughout; no atrophy noted Sensory: Pinprick and light touch intact throughout, bilaterally Deep Tendon Reflexes: 2+ and symmetric with absent AJ's bilaterally Plantars: Right: mute   Left: mute Cerebellar: Normal finger-to-nose and normal heel-to-shin testing bilaterally Gait: normal gait and station   Laboratory Studies:  Basic Metabolic Panel:  Recent Labs Lab 05/23/17 1952  NA 136  K 3.8  CL 102  CO2 28  GLUCOSE 143*  BUN 14  CREATININE 1.41*  CALCIUM 8.7*    Liver Function Tests:  Recent Labs Lab 05/23/17 1952  AST 24  ALT 15*  ALKPHOS 52  BILITOT 0.8  PROT 7.3  ALBUMIN 4.2   No results for input(s): LIPASE, AMYLASE in the last 168 hours. No results for input(s): AMMONIA in the last 168 hours.  CBC:  Recent Labs Lab 05/23/17 1952  WBC 6.8  HGB 13.6  HCT 39.7*  MCV 85.0  PLT 223    Cardiac Enzymes:  Recent Labs Lab 05/23/17 1952 05/24/17 0135 05/24/17 0739  TROPONINI <0.03 <0.03 <0.03    BNP: Invalid input(s): POCBNP  CBG: No results for input(s): GLUCAP in the last 168 hours.  Microbiology: No results found for this or any previous visit.  Coagulation Studies: No results for input(s): LABPROT, INR in the last 72 hours.  Urinalysis:  Recent Labs Lab 05/23/17 1956  COLORURINE STRAW*  LABSPEC 1.008  PHURINE 7.0  GLUCOSEU 50*  HGBUR NEGATIVE  BILIRUBINUR NEGATIVE  KETONESUR NEGATIVE  PROTEINUR NEGATIVE  NITRITE NEGATIVE  LEUKOCYTESUR NEGATIVE    Lipid Panel:    Component Value Date/Time   CHOL 204 (H) 05/24/2017 0739   TRIG 127 05/24/2017 0739   HDL 29 (L) 05/24/2017 0739   CHOLHDL 7.0 05/24/2017 0739   VLDL 25 05/24/2017 0739   LDLCALC 150 (H) 05/24/2017 0739    HgbA1C: No results found for: HGBA1C  Urine  Drug Screen:     Component Value Date/Time   LABOPIA NONE DETECTED 05/23/2017 1956   COCAINSCRNUR NONE DETECTED 05/23/2017 1956   LABBENZ NONE DETECTED 05/23/2017 1956   AMPHETMU NONE DETECTED 05/23/2017 1956   THCU NONE DETECTED 05/23/2017 1956   LABBARB NONE DETECTED 05/23/2017 1956    Alcohol Level: No results for input(s): ETH in the last 168 hours.  Other results: EKG: sinus rhythm at 70 bpm  Imaging: Ct Head Wo Contrast  Result Date: 05/23/2017 CLINICAL DATA:  Blind spots, vision changes EXAM: CT HEAD WITHOUT CONTRAST TECHNIQUE: Contiguous axial images were obtained from the base of the skull through the vertex without intravenous contrast. COMPARISON:  07/09/2016 FINDINGS: Brain: No evidence of acute infarction, hemorrhage, hydrocephalus, extra-axial collection or mass lesion/mass effect. Vascular: No hyperdense vessel or unexpected calcification. Skull: Normal. Negative for fracture or focal lesion. Sinuses/Orbits: Mild mucosal thickening in the ethmoid sinuses. No acute orbital abnormality. Other: None IMPRESSION: No CT evidence for acute intracranial abnormality. Electronically Signed   By: Donavan Foil M.D.   On: 05/23/2017 20:15   Mr Jodene Nam Head Wo Contrast  Result Date: 05/24/2017 CLINICAL DATA:  Initial evaluation for sudden onset memory loss. Visual changes. EXAM: MRI HEAD WITHOUT CONTRAST MRA HEAD WITHOUT CONTRAST TECHNIQUE: Multiplanar, multiecho pulse sequences of the brain and surrounding structures were obtained without intravenous contrast. Angiographic images of the head were obtained  using MRA technique without contrast. COMPARISON:  Prior CT from 05/23/2017. FINDINGS: MRI HEAD FINDINGS Brain: Study mildly degraded by motion artifact. Age-related cerebral atrophy present. Mild T2/FLAIR hyperintensity present within the periventricular white matter, most like related chronic small vessel ischemic disease, minimal for age. No abnormal foci of restricted diffusion to suggest  acute or subacute ischemia. Gray-white matter differentiation maintained. No encephalomalacia to suggest chronic infarction. No evidence for acute or chronic intracranial hemorrhage. No mass lesion, midline shift or mass effect. Ventricles normal in size without evidence for hydrocephalus. No extra-axial fluid collection. Major dural sinuses are grossly patent. Pituitary gland suprasellar region within normal limits. Midline structures intact and normal. Vascular: Major intracranial vascular flow voids are maintained. Skull and upper cervical spine: Craniocervical junction within normal limits. Bone marrow signal intensity within normal limits. Visualized upper cervical spine unremarkable. No scalp soft tissue abnormality. Sinuses/Orbits: Globes and orbital soft tissues within normal limits. Scattered mucosal thickening within the ethmoidal air cells and maxillary sinuses. Superimposed retention cyst present within the left maxillary sinus. Trace right mastoid effusion noted. Left mastoid air cells clear. Inner ear structures normal. MRA HEAD FINDINGS ANTERIOR CIRCULATION: Petrous segments incompletely visualized on time-of-flight source imaging. Visualized petrous segments patent without stenosis. Cavernous and supraclinoid segments patent without flow-limiting stenosis. Left A1 segment widely patent. Right A1 segment hypoplastic and/ or absent, which likely accounts for the diminutive right ICA is compared to the left. Anterior communicating artery normal. Anterior cerebral arteries patent to their distal aspects. M1 segments patent without stenosis or occlusion. No proximal M2 occlusion. Distal MCA branches well opacified and symmetric. POSTERIOR CIRCULATION: Vertebral arteries poorly evaluated on time-of-flight source imaging. Vertebrobasilar junction normal. PCAs patent to their distal aspects without stenosis. Hypoplastic left P1 segment with prominent left posterior communicating artery noted. Small right  posterior communicating artery noted as well. No aneurysm or vascular malformation. IMPRESSION: MRI HEAD IMPRESSION: 1. No acute intracranial infarct or other process identified. 2. Mild for age chronic small vessel ischemic disease. MRA HEAD IMPRESSION: Negative intracranial MRA. No large or proximal arterial branch occlusion. No high-grade or correctable stenosis. Electronically Signed   By: Jeannine Boga M.D.   On: 05/24/2017 04:30   Mr Brain Wo Contrast  Result Date: 05/24/2017 CLINICAL DATA:  Initial evaluation for sudden onset memory loss. Visual changes. EXAM: MRI HEAD WITHOUT CONTRAST MRA HEAD WITHOUT CONTRAST TECHNIQUE: Multiplanar, multiecho pulse sequences of the brain and surrounding structures were obtained without intravenous contrast. Angiographic images of the head were obtained using MRA technique without contrast. COMPARISON:  Prior CT from 05/23/2017. FINDINGS: MRI HEAD FINDINGS Brain: Study mildly degraded by motion artifact. Age-related cerebral atrophy present. Mild T2/FLAIR hyperintensity present within the periventricular white matter, most like related chronic small vessel ischemic disease, minimal for age. No abnormal foci of restricted diffusion to suggest acute or subacute ischemia. Gray-white matter differentiation maintained. No encephalomalacia to suggest chronic infarction. No evidence for acute or chronic intracranial hemorrhage. No mass lesion, midline shift or mass effect. Ventricles normal in size without evidence for hydrocephalus. No extra-axial fluid collection. Major dural sinuses are grossly patent. Pituitary gland suprasellar region within normal limits. Midline structures intact and normal. Vascular: Major intracranial vascular flow voids are maintained. Skull and upper cervical spine: Craniocervical junction within normal limits. Bone marrow signal intensity within normal limits. Visualized upper cervical spine unremarkable. No scalp soft tissue abnormality.  Sinuses/Orbits: Globes and orbital soft tissues within normal limits. Scattered mucosal thickening within the ethmoidal air cells and maxillary sinuses. Superimposed  retention cyst present within the left maxillary sinus. Trace right mastoid effusion noted. Left mastoid air cells clear. Inner ear structures normal. MRA HEAD FINDINGS ANTERIOR CIRCULATION: Petrous segments incompletely visualized on time-of-flight source imaging. Visualized petrous segments patent without stenosis. Cavernous and supraclinoid segments patent without flow-limiting stenosis. Left A1 segment widely patent. Right A1 segment hypoplastic and/ or absent, which likely accounts for the diminutive right ICA is compared to the left. Anterior communicating artery normal. Anterior cerebral arteries patent to their distal aspects. M1 segments patent without stenosis or occlusion. No proximal M2 occlusion. Distal MCA branches well opacified and symmetric. POSTERIOR CIRCULATION: Vertebral arteries poorly evaluated on time-of-flight source imaging. Vertebrobasilar junction normal. PCAs patent to their distal aspects without stenosis. Hypoplastic left P1 segment with prominent left posterior communicating artery noted. Small right posterior communicating artery noted as well. No aneurysm or vascular malformation. IMPRESSION: MRI HEAD IMPRESSION: 1. No acute intracranial infarct or other process identified. 2. Mild for age chronic small vessel ischemic disease. MRA HEAD IMPRESSION: Negative intracranial MRA. No large or proximal arterial branch occlusion. No high-grade or correctable stenosis. Electronically Signed   By: Jeannine Boga M.D.   On: 05/24/2017 04:30   US Carotid Bilateral (at [facility] Only)  Result Date: 05/24/2017 CLINICAL DATA:  TIA.  Sudden onset of memory loss.  Visual changes. EXAM: BILATERAL CAROTID DUPLEX ULTRASOUND TECHNIQUE: Pearline Cables scale imaging, color Doppler and duplex ultrasound were performed of bilateral carotid and  vertebral arteries in the neck. COMPARISON:  Brain MRI -05/24/2017 FINDINGS: Criteria: Quantification of carotid stenosis is based on velocity parameters that correlate the residual internal carotid diameter with NASCET-based stenosis levels, using the diameter of the distal internal carotid lumen as the denominator for stenosis measurement. The following velocity measurements were obtained: RIGHT ICA:  82/25 cm/sec CCA:  34/74 cm/sec SYSTOLIC ICA/CCA RATIO:  0.9 DIASTOLIC ICA/CCA RATIO:  2.1 ECA:  108 cm/sec LEFT ICA:  97/24 cm/sec CCA:  25/95 cm/sec SYSTOLIC ICA/CCA RATIO:  1.0 DIASTOLIC ICA/CCA RATIO:  1.4 ECA:  137 cm/sec RIGHT CAROTID ARTERY: There is a minimal amount of eccentric mixed echogenic plaque within the right carotid bulb (images 14 and 15). There is a moderate amount of eccentric mixed echogenic plaque involving the origin and proximal aspects of the right internal carotid artery (image 22), not resulting in elevated peak systolic velocities within the interrogated course of the right internal carotid artery to suggest a hemodynamically significant stenosis. RIGHT VERTEBRAL ARTERY:  Antegrade flow LEFT CAROTID ARTERY: There is a minimal amount of eccentric mixed echogenic plaque within the left carotid bulb (image 46), extending to involve the origin and proximal aspects of the left internal carotid artery (image 53), not resulting in elevated peak systolic velocities within the interrogated course the left internal carotid artery to suggest a hemodynamically significant stenosis. LEFT VERTEBRAL ARTERY:  Antegrade flow IMPRESSION: Minimal amount of bilateral atherosclerotic plaque, left subjectively greater than right, not resulting in a hemodynamically significant stenosis within either internal carotid artery. Electronically Signed   By: Sandi Mariscal M.D.   On: 05/24/2017 09:13    Assessment: 61 y.o. male presenting with visual changes and memory disturbances that have resolved.  MRI of the  brain reviewed and shows no acute changes.  MRA unremarkable as well.  Presentation most consistent with ocular migraine.  Cerebral ischemia less likely.   Carotid dopplers show no evidence of hemodynamically significant stenosis.  Echocardiogram shows no cardiac source of emboli with an EF of 55-65%.  A1c pending,  LDL 150.  Stroke Risk Factors - hypertension  Plan: 1. ASA 81mg  daily 2. No further neurologic intervention is recommended at this time.  If further questions arise, please call or page at that time.  Thank you for allowing neurology to participate in the care of this patient.  Patient to follow up with neurology on an outpatient basis.  To see eye doctor to rule out glaucoma.   Case discussed with Dr. Burnett Sheng, MD Neurology [phone] 05/24/2017, 9:56 PM

## 2017-05-24 NOTE — Discharge Instructions (Signed)
Migraine headache involving the eye Recommend neurology follow up as outpatient

## 2017-05-24 NOTE — Evaluation (Signed)
Physical Therapy Evaluation Patient Details Name: Benjamin Parker MRN: [id number] DOB: March 18, 1956 Today's Date: 05/24/2017   History of Present Illness  Pt is a 61 y.o. male with a known history of HTN, in a usual state of health until today around 6:30PM 05/23/17, when he had sudden onset of memory loss where he could not remember people's names. He also had vision changes including flashing lights and floaters.  His symptoms have fully resolved.   Clinical Impression  Pt was seen for evaluation of his mobility with PT and balance testing done to demonstrate his safety.  Has some minor coordination change of LLE that shows up with sudden changes of direction of gait and minimal strength change but all test scores are [facility].  Will anticipate he continue at home with understanding that he get MD to refer PT if there is an issue.  Discharge PT for now.    Follow Up Recommendations No PT follow up;Other (comment) (will talk with MD if LLE is an issue at home)    Equipment Recommendations  None recommended by PT    Recommendations for Other Services       Precautions / Restrictions Precautions Precautions: None Restrictions Weight Bearing Restrictions: No      Mobility  Bed Mobility Overal bed mobility: Independent             General bed mobility comments: steady, good confidence, no LOB  Transfers Overall transfer level: Independent Equipment used: None             General transfer comment: no hands needed  Ambulation/Gait Ambulation/Gait assistance: Independent Ambulation Distance (Feet): 300 Feet Assistive device: None Gait Pattern/deviations: Step-through pattern;Narrow base of support;Trunk flexed;Decreased stride length Gait velocity: normal Gait velocity interpretation: at or above normal speed for age/gender    Stairs            Wheelchair Mobility    Modified Rankin (Stroke Patients Only) Modified Rankin (Stroke Patients Only) Pre-Morbid Rankin  Score: No symptoms Modified Rankin: No significant disability     Balance Overall balance assessment: Modified Independent                               Standardized Balance Assessment Standardized Balance Assessment : TUG: Timed Up and Go Test (Tinetti=26/28)     Timed Up and Go Test TUG: Normal TUG Normal TUG (seconds): 8.32     Pertinent Vitals/Pain Pain Assessment: No/denies pain    Home Living Family/patient expects to be discharged to:: Private residence Living Arrangements: Spouse/significant other;Children Available Help at Discharge: Family;Available 24 hours/day;Available PRN/intermittently Type of Home: House Home Access: Stairs to enter Entrance Stairs-Rails: Can reach both;Right;Left Entrance Stairs-Number of Steps: 2 Home Layout: One level Home Equipment: None      Prior Function Level of Independence: Independent         Comments: I and does not own an AD     Hand Dominance   Dominant Hand: Right    Extremity/Trunk Assessment   Upper Extremity Assessment Upper Extremity Assessment: Overall WFL for tasks assessed    Lower Extremity Assessment Lower Extremity Assessment:  (has weakness of 4 to 4+ L hamstrings and DF)    Cervical / Trunk Assessment Cervical / Trunk Assessment: Normal  Communication   Communication: No difficulties  Cognition Arousal/Alertness: Awake/alert Behavior During Therapy: WFL for tasks assessed/performed Overall Cognitive Status: Within Functional Limits for tasks assessed  General Comments: A&Ox4, follows commands, good safety awareness      General Comments      Exercises     Assessment/Plan    PT Assessment Patent does not need any further PT services  PT Problem List         PT Treatment Interventions      PT Goals (Current goals can be found in the Care Plan section)  Acute Rehab PT Goals Patient Stated Goal: go home PT Goal  Formulation: With patient/family Time For Goal Achievement: 05/25/17 Potential to Achieve Goals: Good    Frequency     Barriers to discharge        Co-evaluation               AM-PAC PT "6 Clicks" Daily Activity  Outcome Measure Difficulty turning over in bed (including adjusting bedclothes, sheets and blankets)?: None Difficulty moving from lying on back to sitting on the side of the bed? : None Difficulty sitting down on and standing up from a chair with arms (e.g., wheelchair, bedside commode, etc,.)?: None Help needed moving to and from a bed to chair (including a wheelchair)?: None Help needed walking in hospital room?: A Little Help needed climbing 3-5 steps with a railing? : A Little 6 Click Score: 22    End of Session Equipment Utilized During Treatment: Gait belt Activity Tolerance: Patient tolerated treatment well Patient left: in bed Nurse Communication: Mobility status PT Visit Diagnosis: Hemiplegia and hemiparesis Hemiplegia - Right/Left: Left Hemiplegia - dominant/non-dominant: Non-dominant Hemiplegia - caused by: Unspecified (TIA)    Time: 1610-9604 PT Time Calculation (min) (ACUTE ONLY): 15 min   Charges:   PT Evaluation $PT Eval Moderate Complexity: 1 Procedure     PT G Codes:   PT G-Codes **NOT FOR INPATIENT CLASS** Functional Assessment Tool Used: AM-PAC 6 Clicks Basic Mobility;Clinical judgement Functional Limitation: Mobility: Walking and moving around Mobility: Walking and Moving Around Current Status ([id number]): At least 1 percent but less than 20 percent impaired, limited or restricted Mobility: Walking and Moving Around Goal Status [phone]): 0 percent impaired, limited or restricted    Ramond Dial 05/24/2017, 1:11 PM   Mee Hives, PT MS Acute Rehab Dept. Number: [place] [facility] [place]

## 2017-05-24 NOTE — Discharge Summary (Signed)
[facility] NAME: Benjamin Parker    MR#:  [id number]  DATE OF BIRTH:  December 07, 1956  DATE OF ADMISSION:  05/23/2017 ADMITTING PHYSICIAN: Benjamin Bridge, DO  DATE OF DISCHARGE: 05/24/2017  1:51 PM  PRIMARY CARE PHYSICIAN: Benjamin Billet, MD    ADMISSION DIAGNOSIS:  Visual disturbance [H53.9] Memory deficit [R41.3] Hypertensive urgency [I16.0] Transient cerebral ischemia, unspecified type [G45.9]  DISCHARGE DIAGNOSIS:  Ocular Migraine  SECONDARY DIAGNOSIS:   Past Medical History:  Diagnosis Date  . Hypertension     HOSPITAL COURSE:   1. Ocular migraine. Patient had headache and left eye visual symptoms. He also had some confusion where he couldn't recall some names. No loss of power. MRI and MRA of the brain were negative. Carotid ultrasound and echocardiogram also normal. Patient seen in consultation by neurology who recommended neurology follow-up as outpatient. We'll given aspirin 81 mg daily. 2. Essential hypertension. Go back on lisinopril. 3. Dehydration and acute kidney injury. Creatinine 1.41 on presentation. Patient was given IV fluids overnight but no repeat creatinine was done. Follow-up as outpatient and repeat creatinine as outpatient.  DISCHARGE CONDITIONS:   Satisfactory  CONSULTS OBTAINED:  Treatment Team:  Benjamin Goodell, MD  DRUG ALLERGIES:  No Known Allergies  DISCHARGE MEDICATIONS:   Discharge Medication List as of 05/24/2017  1:22 PM    START taking these medications   Details  aspirin EC 81 MG EC tablet Take 1 tablet (81 mg total) by mouth daily., Starting Sun 05/25/2017, Print      CONTINUE these medications which have NOT CHANGED   Details  lisinopril (PRINIVIL,ZESTRIL) 10 MG tablet Starting Fri 02/14/2017, Historical Med      STOP taking these medications     meloxicam (MOBIC) 15 MG tablet          DISCHARGE INSTRUCTIONS:   Follow-up PMD one week Follow-up neurology as  outpatient. Can also follow up with ophthalmology as outpatient.  If you experience worsening of your admission symptoms, develop shortness of breath, life threatening emergency, suicidal or homicidal thoughts you must seek medical attention immediately by calling 911 or calling your MD immediately  if symptoms less severe.  You Must read complete instructions/literature along with all the possible adverse reactions/side effects for all the Medicines you take and that have been prescribed to you. Take any new Medicines after you have completely understood and accept all the possible adverse reactions/side effects.   Please note  You were cared for by a hospitalist during your hospital stay. If you have any questions about your discharge medications or the care you received while you were in the hospital after you are discharged, you can call the unit and asked to speak with the hospitalist on call if the hospitalist that took care of you is not available. Once you are discharged, your primary care physician will handle any further medical issues. Please note that NO REFILLS for any discharge medications will be authorized once you are discharged, as it is imperative that you return to your primary care physician (or establish a relationship with a primary care physician if you do not have one) for your aftercare needs so that they can reassess your need for medications and monitor your lab values.    Today   CHIEF COMPLAINT:   Chief Complaint  Patient presents with  . Loss of Vision  . Neck Pain  . Memory Loss    HISTORY OF PRESENT ILLNESS:  Benjamin Parker  is a 61 y.o. male presented with transient loss of vision and headache   VITAL SIGNS:  Blood pressure (!) 147/73, pulse 75, temperature 98.1 F (36.7 C), temperature source Oral, resp. rate 18, height 5\' 10"  (1.778 m), weight 94.8 kg (209 lb), SpO2 95 %.    PHYSICAL EXAMINATION:  GENERAL:  61 y.o.-year-old patient lying in the  bed with no acute distress.  EYES: Pupils equal, round, reactive to light and accommodation. No scleral icterus. Extraocular muscles intact.  HEENT: Head atraumatic, normocephalic. Oropharynx and nasopharynx clear.  NECK:  Supple, no jugular venous distention. No thyroid enlargement, no tenderness.  LUNGS: Normal breath sounds bilaterally, no wheezing, rales,rhonchi or crepitation. No use of accessory muscles of respiration.  CARDIOVASCULAR: S1, S2 normal. No murmurs, rubs, or gallops.  ABDOMEN: Soft, non-tender, non-distended. Bowel sounds present. No organomegaly or mass.  EXTREMITIES: No pedal edema, cyanosis, or clubbing.  NEUROLOGIC: Cranial nerves II through XII are intact. Muscle strength 5/5 in all extremities. Sensation intact. Gait not checked.  PSYCHIATRIC: The patient is alert and oriented x 3.  SKIN: No obvious rash, lesion, or ulcer.   DATA REVIEW:   CBC  Recent Labs Lab 05/23/17 1952  WBC 6.8  HGB 13.6  HCT 39.7*  PLT 223    Chemistries   Recent Labs Lab 05/23/17 1952  NA 136  K 3.8  CL 102  CO2 28  GLUCOSE 143*  BUN 14  CREATININE 1.41*  CALCIUM 8.7*  AST 24  ALT 15*  ALKPHOS 52  BILITOT 0.8    Cardiac Enzymes  Recent Labs Lab 05/24/17 0739  TROPONINI <0.03      RADIOLOGY:  Ct Head Wo Contrast  Result Date: 05/23/2017 CLINICAL DATA:  Blind spots, vision changes EXAM: CT HEAD WITHOUT CONTRAST TECHNIQUE: Contiguous axial images were obtained from the base of the skull through the vertex without intravenous contrast. COMPARISON:  07/09/2016 FINDINGS: Brain: No evidence of acute infarction, hemorrhage, hydrocephalus, extra-axial collection or mass lesion/mass effect. Vascular: No hyperdense vessel or unexpected calcification. Skull: Normal. Negative for fracture or focal lesion. Sinuses/Orbits: Mild mucosal thickening in the ethmoid sinuses. No acute orbital abnormality. Other: None IMPRESSION: No CT evidence for acute intracranial abnormality.  Electronically Signed   By: Benjamin Parker M.D.   On: 05/23/2017 20:15   Mr Benjamin Parker Head Wo Contrast  Result Date: 05/24/2017 CLINICAL DATA:  Initial evaluation for sudden onset memory loss. Visual changes. EXAM: MRI HEAD WITHOUT CONTRAST MRA HEAD WITHOUT CONTRAST TECHNIQUE: Multiplanar, multiecho pulse sequences of the brain and surrounding structures were obtained without intravenous contrast. Angiographic images of the head were obtained using MRA technique without contrast. COMPARISON:  Prior CT from 05/23/2017. FINDINGS: MRI HEAD FINDINGS Brain: Study mildly degraded by motion artifact. Age-related cerebral atrophy present. Mild T2/FLAIR hyperintensity present within the periventricular white matter, most like related chronic small vessel ischemic disease, minimal for age. No abnormal foci of restricted diffusion to suggest acute or subacute ischemia. Gray-white matter differentiation maintained. No encephalomalacia to suggest chronic infarction. No evidence for acute or chronic intracranial hemorrhage. No mass lesion, midline shift or mass effect. Ventricles normal in size without evidence for hydrocephalus. No extra-axial fluid collection. Major dural sinuses are grossly patent. Pituitary gland suprasellar region within normal limits. Midline structures intact and normal. Vascular: Major intracranial vascular flow voids are maintained. Skull and upper cervical spine: Craniocervical junction within normal limits. Bone marrow signal intensity within normal limits. Visualized upper cervical spine unremarkable. No scalp soft tissue abnormality. Sinuses/Orbits: Globes and  orbital soft tissues within normal limits. Scattered mucosal thickening within the ethmoidal air cells and maxillary sinuses. Superimposed retention cyst present within the left maxillary sinus. Trace right mastoid effusion noted. Left mastoid air cells clear. Inner ear structures normal. MRA HEAD FINDINGS ANTERIOR CIRCULATION: Petrous segments  incompletely visualized on time-of-flight source imaging. Visualized petrous segments patent without stenosis. Cavernous and supraclinoid segments patent without flow-limiting stenosis. Left A1 segment widely patent. Right A1 segment hypoplastic and/ or absent, which likely accounts for the diminutive right ICA is compared to the left. Anterior communicating artery normal. Anterior cerebral arteries patent to their distal aspects. M1 segments patent without stenosis or occlusion. No proximal M2 occlusion. Distal MCA branches well opacified and symmetric. POSTERIOR CIRCULATION: Vertebral arteries poorly evaluated on time-of-flight source imaging. Vertebrobasilar junction normal. PCAs patent to their distal aspects without stenosis. Hypoplastic left P1 segment with prominent left posterior communicating artery noted. Small right posterior communicating artery noted as well. No aneurysm or vascular malformation. IMPRESSION: MRI HEAD IMPRESSION: 1. No acute intracranial infarct or other process identified. 2. Mild for age chronic small vessel ischemic disease. MRA HEAD IMPRESSION: Negative intracranial MRA. No large or proximal arterial branch occlusion. No high-grade or correctable stenosis. Electronically Signed   By: Jeannine Boga M.D.   On: 05/24/2017 04:30   Mr Brain Wo Contrast  Result Date: 05/24/2017 CLINICAL DATA:  Initial evaluation for sudden onset memory loss. Visual changes. EXAM: MRI HEAD WITHOUT CONTRAST MRA HEAD WITHOUT CONTRAST TECHNIQUE: Multiplanar, multiecho pulse sequences of the brain and surrounding structures were obtained without intravenous contrast. Angiographic images of the head were obtained using MRA technique without contrast. COMPARISON:  Prior CT from 05/23/2017. FINDINGS: MRI HEAD FINDINGS Brain: Study mildly degraded by motion artifact. Age-related cerebral atrophy present. Mild T2/FLAIR hyperintensity present within the periventricular white matter, most like related  chronic small vessel ischemic disease, minimal for age. No abnormal foci of restricted diffusion to suggest acute or subacute ischemia. Gray-white matter differentiation maintained. No encephalomalacia to suggest chronic infarction. No evidence for acute or chronic intracranial hemorrhage. No mass lesion, midline shift or mass effect. Ventricles normal in size without evidence for hydrocephalus. No extra-axial fluid collection. Major dural sinuses are grossly patent. Pituitary gland suprasellar region within normal limits. Midline structures intact and normal. Vascular: Major intracranial vascular flow voids are maintained. Skull and upper cervical spine: Craniocervical junction within normal limits. Bone marrow signal intensity within normal limits. Visualized upper cervical spine unremarkable. No scalp soft tissue abnormality. Sinuses/Orbits: Globes and orbital soft tissues within normal limits. Scattered mucosal thickening within the ethmoidal air cells and maxillary sinuses. Superimposed retention cyst present within the left maxillary sinus. Trace right mastoid effusion noted. Left mastoid air cells clear. Inner ear structures normal. MRA HEAD FINDINGS ANTERIOR CIRCULATION: Petrous segments incompletely visualized on time-of-flight source imaging. Visualized petrous segments patent without stenosis. Cavernous and supraclinoid segments patent without flow-limiting stenosis. Left A1 segment widely patent. Right A1 segment hypoplastic and/ or absent, which likely accounts for the diminutive right ICA is compared to the left. Anterior communicating artery normal. Anterior cerebral arteries patent to their distal aspects. M1 segments patent without stenosis or occlusion. No proximal M2 occlusion. Distal MCA branches well opacified and symmetric. POSTERIOR CIRCULATION: Vertebral arteries poorly evaluated on time-of-flight source imaging. Vertebrobasilar junction normal. PCAs patent to their distal aspects without  stenosis. Hypoplastic left P1 segment with prominent left posterior communicating artery noted. Small right posterior communicating artery noted as well. No aneurysm or vascular malformation. IMPRESSION: MRI HEAD  IMPRESSION: 1. No acute intracranial infarct or other process identified. 2. Mild for age chronic small vessel ischemic disease. MRA HEAD IMPRESSION: Negative intracranial MRA. No large or proximal arterial branch occlusion. No high-grade or correctable stenosis. Electronically Signed   By: Jeannine Boga M.D.   On: 05/24/2017 04:30   US Carotid Bilateral (at [facility] Only)  Result Date: 05/24/2017 CLINICAL DATA:  TIA.  Sudden onset of memory loss.  Visual changes. EXAM: BILATERAL CAROTID DUPLEX ULTRASOUND TECHNIQUE: Pearline Cables scale imaging, color Doppler and duplex ultrasound were performed of bilateral carotid and vertebral arteries in the neck. COMPARISON:  Brain MRI -05/24/2017 FINDINGS: Criteria: Quantification of carotid stenosis is based on velocity parameters that correlate the residual internal carotid diameter with NASCET-based stenosis levels, using the diameter of the distal internal carotid lumen as the denominator for stenosis measurement. The following velocity measurements were obtained: RIGHT ICA:  82/25 cm/sec CCA:  14/78 cm/sec SYSTOLIC ICA/CCA RATIO:  0.9 DIASTOLIC ICA/CCA RATIO:  2.1 ECA:  108 cm/sec LEFT ICA:  97/24 cm/sec CCA:  29/56 cm/sec SYSTOLIC ICA/CCA RATIO:  1.0 DIASTOLIC ICA/CCA RATIO:  1.4 ECA:  137 cm/sec RIGHT CAROTID ARTERY: There is a minimal amount of eccentric mixed echogenic plaque within the right carotid bulb (images 14 and 15). There is a moderate amount of eccentric mixed echogenic plaque involving the origin and proximal aspects of the right internal carotid artery (image 22), not resulting in elevated peak systolic velocities within the interrogated course of the right internal carotid artery to suggest a hemodynamically significant stenosis. RIGHT  VERTEBRAL ARTERY:  Antegrade flow LEFT CAROTID ARTERY: There is a minimal amount of eccentric mixed echogenic plaque within the left carotid bulb (image 46), extending to involve the origin and proximal aspects of the left internal carotid artery (image 53), not resulting in elevated peak systolic velocities within the interrogated course the left internal carotid artery to suggest a hemodynamically significant stenosis. LEFT VERTEBRAL ARTERY:  Antegrade flow IMPRESSION: Minimal amount of bilateral atherosclerotic plaque, left subjectively greater than right, not resulting in a hemodynamically significant stenosis within either internal carotid artery. Electronically Signed   By: Sandi Mariscal M.D.   On: 05/24/2017 09:13      Management plans discussed with the patient, family and they are in agreement.  CODE STATUS:     Code Status Orders        Start     Ordered   05/24/17 0112  Full code  Continuous     05/24/17 0111    Code Status History    Date Active Date Inactive Code Status Order ID Comments User Context   This patient has a current code status but no historical code status.      TOTAL TIME TAKING CARE OF THIS PATIENT: 35 minutes.    Loletha Grayer M.D on 05/24/2017 at 4:21 PM  Between 7am to 6pm - Pager - [phone]  After 6pm go to [email or web address] - password [email or web address] [facility]  [phone]  CC: Primary care physician; Benjamin Billet, MD

## 2017-05-24 NOTE — Progress Notes (Signed)
SLP Note  Patient Details Name: Benjamin Parker MRN: [id number] DOB: 1956/09/11   Cancelled treatment:       Reason Eval/Treat Not Completed: SLP screened, no needs identified, will sign off Received orders and reviewed chart and spoke with pt/family and nsg. Pt reports that he initially came into hospital with vision changes and difficulty recalling peoples' names. Pt and family report that all symptoms have resolved and pt is back to baseline. Pt was able to converse appropriately with ST. No language deficits noted. Pt denies any swallowing difficulties and has been eating a regular diet since admission. Skilled ST is not indicated at this time. Please re-consult if further concerns arise.    Wittmann,Lucero Auzenne 05/24/2017, 11:33 AM

## 2017-05-25 LAB — HEMOGLOBIN A1C
Hgb A1c MFr Bld: 6 % — ABNORMAL HIGH (ref 4.8–5.6)
Mean Plasma Glucose: 126 mg/dL

## 2017-05-25 LAB — HIV ANTIBODY (ROUTINE TESTING W REFLEX): HIV Screen 4th Generation wRfx: NONREACTIVE

## 2018-08-17 IMAGING — CT CT HEAD W/O CM
3 series · 16 of 47 positions shown, 19 images · non-contrast
Comparison: 07/09/2016

CLINICAL DATA: Blind spots, vision changes

EXAM:
CT HEAD WITHOUT CONTRAST
TECHNIQUE: Contiguous axial images were obtained from the base of the skull
through the vertex without intravenous contrast.

[Series 2: head wo · axial · 0.44mm/px · z∈[+99,+229]mm · 10 of 32 slices shown, 13 images]
[im 3/32  brain]
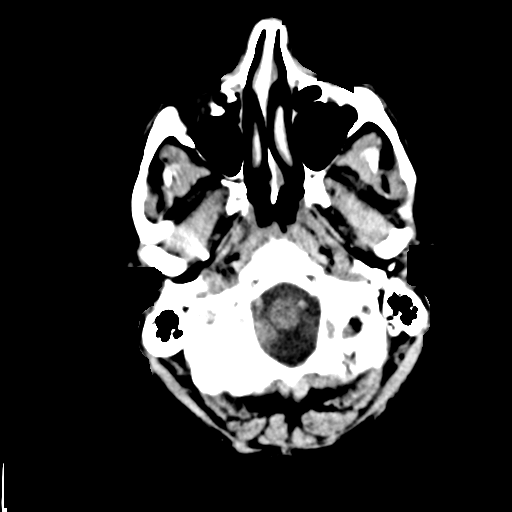
[im 3/32  bone]
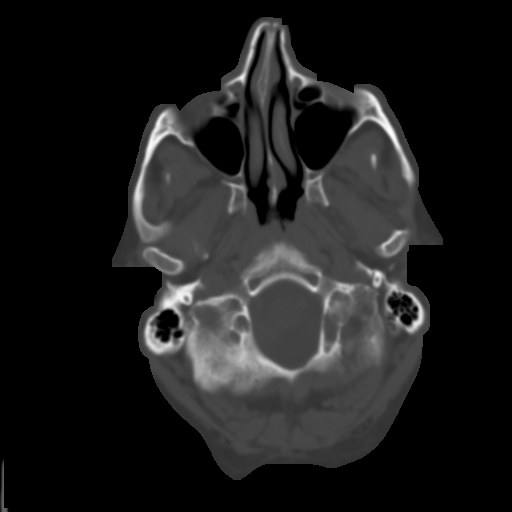
[im 6/32  brain]
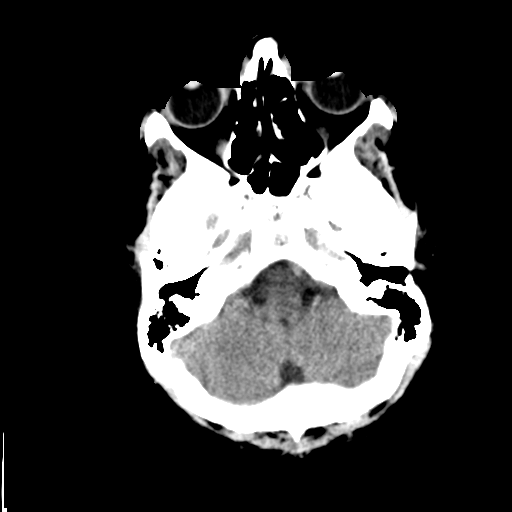
[im 9/32  brain]
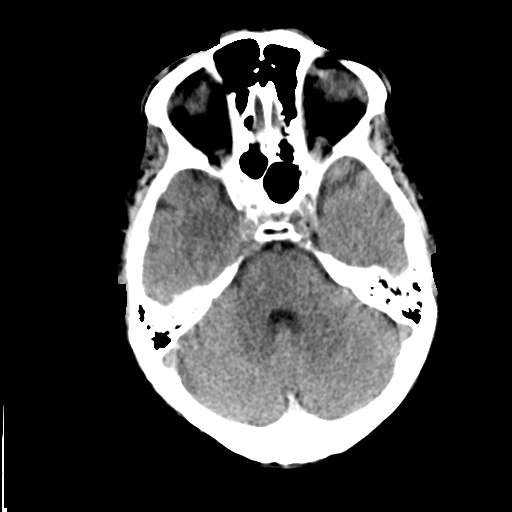
[im 11/32  brain]
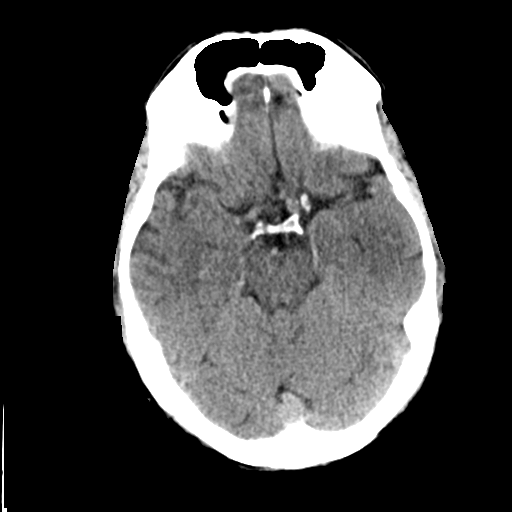
[im 14/32  brain]
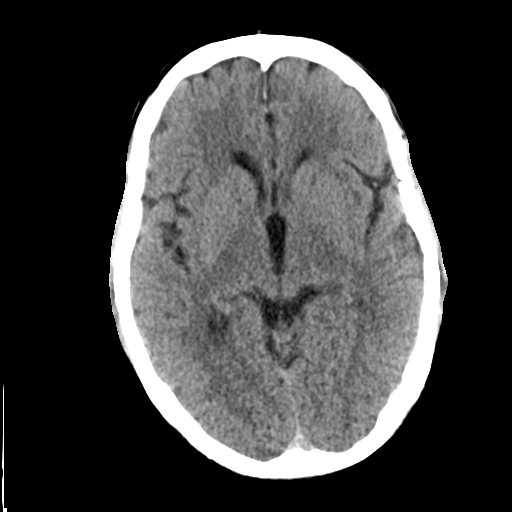
[im 14/32  bone]
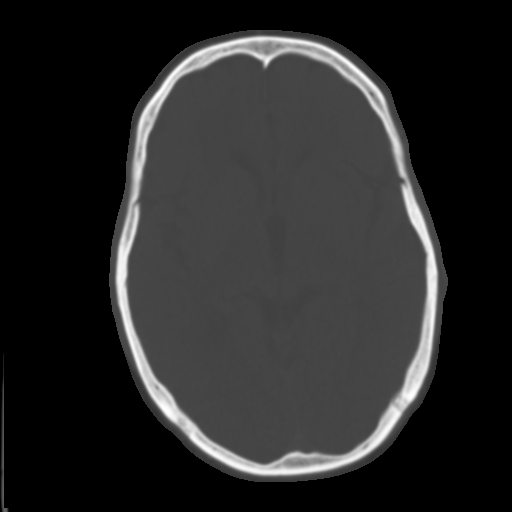
[im 18/32  brain]
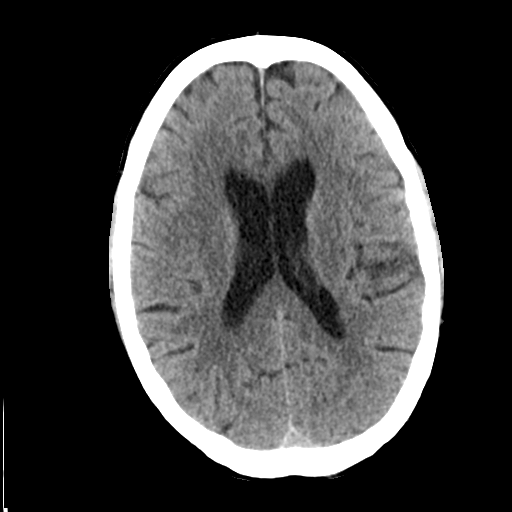
[im 21/32  brain]
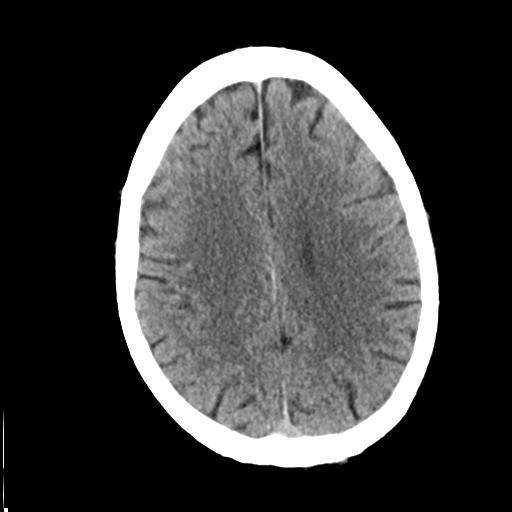
[im 24/32  brain]
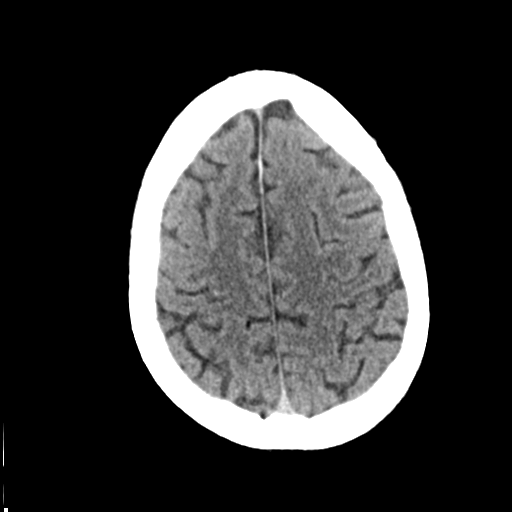
[im 26/32  brain]
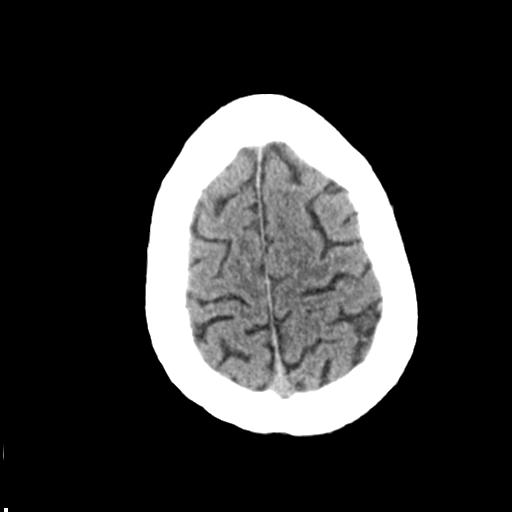
[im 26/32  bone]
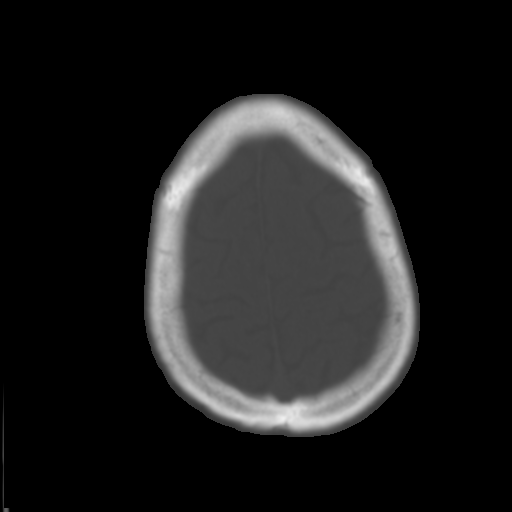
[im 29/32  brain]
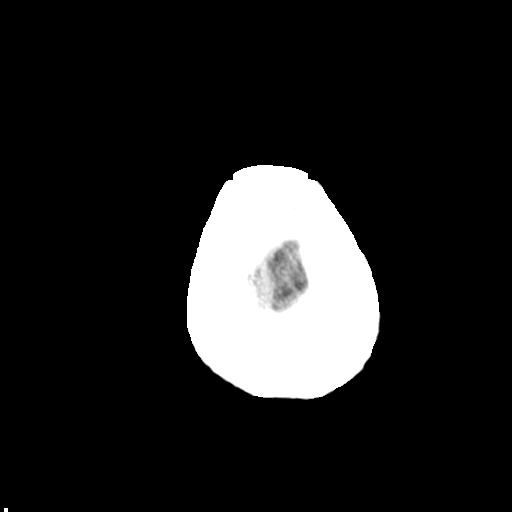

[Series 4: coronal soft tissue · coronal · 0.34mm/px · 3 of 75 slices shown]
[im 25/75  brain]
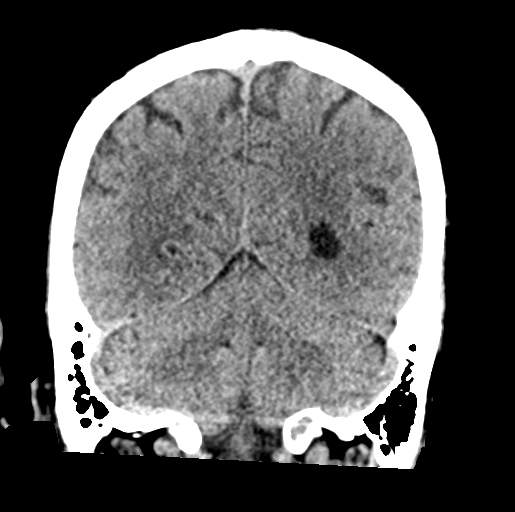
[im 33/75  brain]
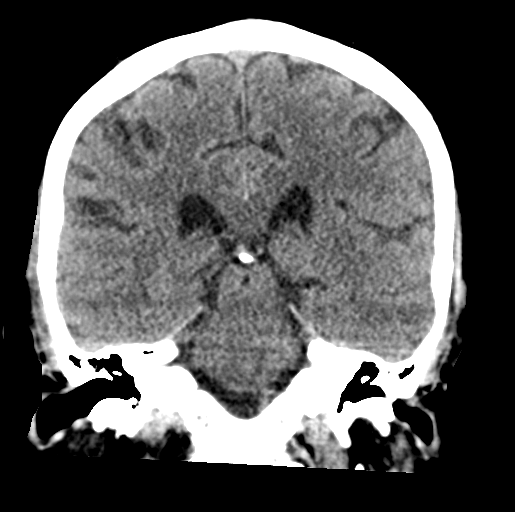
[im 42/75  brain]
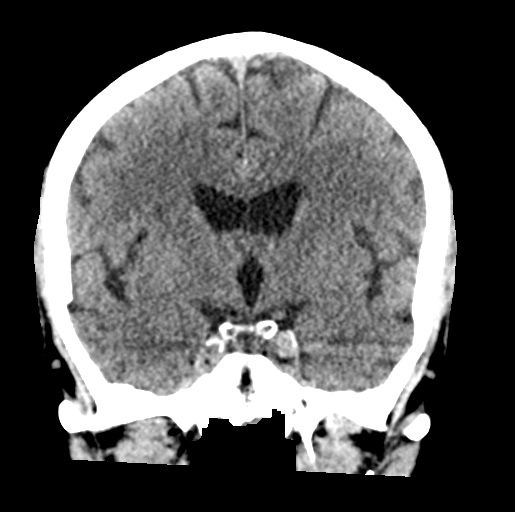

[Series 5: sagittal soft tissue · sagittal · 0.33mm/px · 3 of 61 slices shown]
[im 21/61  brain]
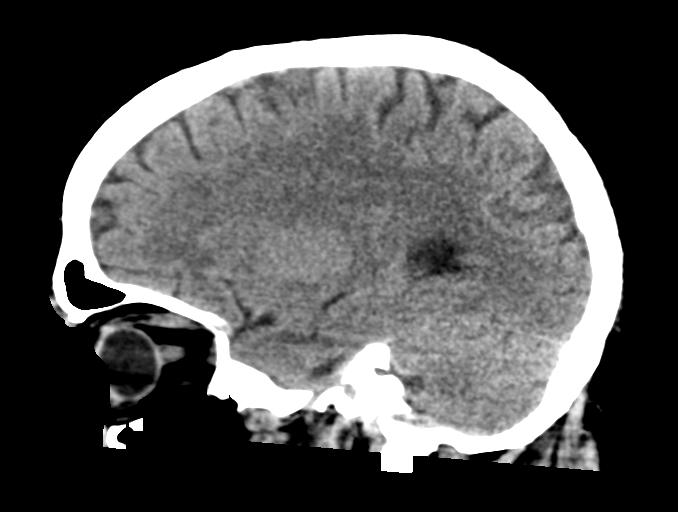
[im 31/61  brain]
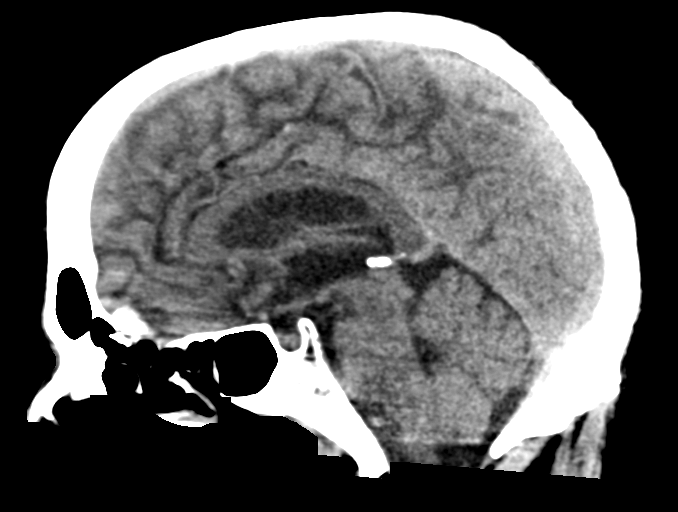
[im 41/61  brain]
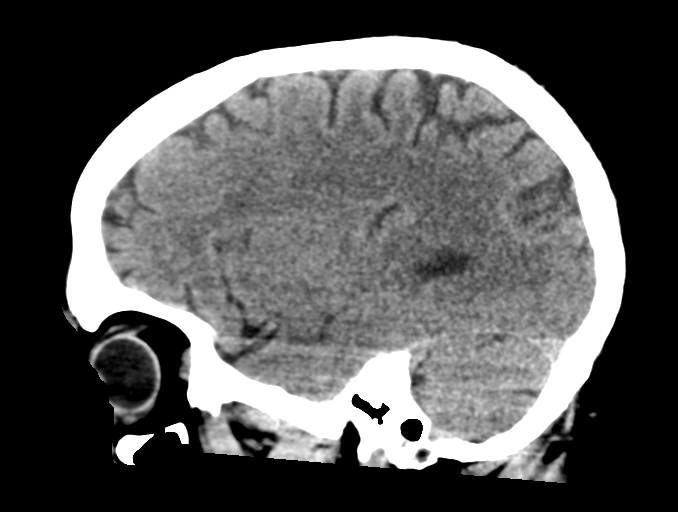

[16 of 47 positions shown; findings below may reference images not displayed]

FINDINGS: Brain: No evidence of acute infarction, hemorrhage, hydrocephalus,
extra-axial collection or mass lesion/mass effect.

Vascular: No hyperdense vessel or unexpected calcification.

Skull: Normal. Negative for fracture or focal lesion.

Sinuses/Orbits: Mild mucosal thickening in the ethmoid sinuses. No
acute orbital abnormality.

Other: None
IMPRESSION: No CT evidence for acute intracranial abnormality.

## 2019-11-08 ENCOUNTER — Emergency Department: Payer: BC Managed Care – PPO

## 2019-11-08 ENCOUNTER — Other Ambulatory Visit: Payer: Self-pay

## 2019-11-08 ENCOUNTER — Emergency Department
Admission: EM | Admit: 2019-11-08 | Discharge: 2019-11-08 | Disposition: A | Discharge status: Home / Self Care | Payer: BC Managed Care – PPO | Attending: Emergency Medicine | Admitting: Emergency Medicine

## 2019-11-08 DIAGNOSIS — I10 Essential (primary) hypertension: Secondary | ICD-10-CM | POA: Diagnosis not present

## 2019-11-08 DIAGNOSIS — Z7982 Long term (current) use of aspirin: Secondary | ICD-10-CM | POA: Insufficient documentation

## 2019-11-08 DIAGNOSIS — Z79899 Other long term (current) drug therapy: Secondary | ICD-10-CM | POA: Diagnosis not present

## 2019-11-08 DIAGNOSIS — R0789 Other chest pain: Secondary | ICD-10-CM | POA: Insufficient documentation

## 2019-11-08 DIAGNOSIS — Z8673 Personal history of transient ischemic attack (TIA), and cerebral infarction without residual deficits: Secondary | ICD-10-CM | POA: Diagnosis not present

## 2019-11-08 LAB — BASIC METABOLIC PANEL
Anion gap: 8 (ref 5–15)
BUN: 19 mg/dL (ref 8–23)
CO2: 26 mmol/L (ref 22–32)
Calcium: 8.9 mg/dL (ref 8.9–10.3)
Chloride: 106 mmol/L (ref 98–111)
Creatinine, Ser: 1.22 mg/dL (ref 0.61–1.24)
GFR calc Af Amer: >60 mL/min (ref >60)
GFR calc non Af Amer: >60 mL/min (ref >60)
Glucose, Bld: 113 mg/dL — ABNORMAL HIGH (ref 70–99)
Potassium: 3.9 mmol/L (ref 3.5–5.1)
Sodium: 140 mmol/L (ref 135–145)

## 2019-11-08 LAB — TROPONIN I (HIGH SENSITIVITY)
Troponin I (High Sensitivity): 5 ng/L (ref <18)
Troponin I (High Sensitivity): 5 ng/L (ref <18)

## 2019-11-08 LAB — CBC
HCT: 36.6 % — ABNORMAL LOW (ref 39.0–52.0)
Hemoglobin: 12.5 g/dL — ABNORMAL LOW (ref 13.0–17.0)
MCH: 29.1 pg (ref 26.0–34.0)
MCHC: 34.2 g/dL (ref 30.0–36.0)
MCV: 85.1 fL (ref 80.0–100.0)
Platelets: 197 10*3/uL (ref 150–400)
RBC: 4.3 MIL/uL (ref 4.22–5.81)
RDW: 12.1 % (ref 11.5–15.5)
WBC: 6.4 10*3/uL (ref 4.0–10.5)
nRBC: 0 % (ref 0.0–0.2)

## 2019-11-08 NOTE — ED Notes (Signed)
ED Provider at bedside. 

## 2019-11-08 NOTE — ED Triage Notes (Signed)
Patient reports took his pressure at home and it was 207/110 and his feet got tingly and chest tight.  Reports took medication that MD prescribed and now he is feeling better.

## 2019-11-08 NOTE — ED Provider Notes (Signed)
[facility] Emergency Department Provider Note   ____________________________________________   First MD Initiated Contact with Patient 11/08/19 0813     (approximate)  I have reviewed the triage vital signs and the nursing notes.   HISTORY  Chief Complaint Hypertension    HPI Benjamin Parker is a 63 y.o. male with past medical history of hypertension who presents to the ED complaining of elevated BP.  Patient reports that his blood pressure has been fluctuating significantly over the past 1 to 2 weeks.  He usually takes 10 mg of lisinopril daily for his blood pressure but when it was elevated on multiple checks at work, he spoke with his PCP.  PCP recommended that he start taking metoprolol.  His blood pressure continued to be elevated and his PCP then prescribed 0.1 mg clonidine to take if his systolic was greater than 175.  Patient states that last night his systolic was greater than 200 and he was feeling tingly with tightness in his chest.  He subsequently took the clonidine and presented to the ED.  He states his chest tightness has since improved and denies any fevers, cough, chest pain, or shortness of breath.  He denies any visual symptoms and has been making a normal amount of urine.        Past Medical History:  Diagnosis Date  . Hypertension     Patient Active Problem List   Diagnosis Date Noted  . TIA (transient ischemic attack) 05/23/2017    Past Surgical History:  Procedure Laterality Date  . HERNIA REPAIR      Prior to Admission medications   Medication Sig Start Date End Date Taking? Authorizing Provider  aspirin EC 81 MG EC tablet Take 1 tablet (81 mg total) by mouth daily. 05/25/17   Loletha Grayer, MD  lisinopril (PRINIVIL,ZESTRIL) 10 MG tablet  02/14/17   [provider]    Allergies Patient has no known allergies.  No family history on file.  Social History Social History   Tobacco Use  . Smoking status:  Never Smoker  . Smokeless tobacco: Never Used  Substance Use Topics  . Alcohol use: No  . Drug use: No    Review of Systems  Constitutional: No fever/chills Eyes: No visual changes. ENT: No sore throat. Cardiovascular: Positive for chest tightness. Respiratory: Denies shortness of breath. Gastrointestinal: No abdominal pain.  No nausea, no vomiting.  No diarrhea.  No constipation. Genitourinary: Negative for dysuria. Musculoskeletal: Negative for back pain. Skin: Negative for rash. Neurological: Negative for headaches, focal weakness or numbness.  ____________________________________________   PHYSICAL EXAM:  VITAL SIGNS: ED Triage Vitals  Enc Vitals Group     BP 11/08/19 0051 (!) 181/88     Pulse Rate 11/08/19 0051 83     Resp 11/08/19 0051 18     Temp 11/08/19 0051 98.7 F (37.1 C)     Temp Source 11/08/19 0051 Oral     SpO2 11/08/19 0051 98 %     Weight 11/08/19 0053 200 lb (90.7 kg)     Height 11/08/19 0053 5\' 10"  (1.778 m)     Head Circumference --      Peak Flow --      Pain Score 11/08/19 0053 0     Pain Loc --      Pain Edu? --      Excl. in [place]? --     Constitutional: Alert and oriented. Eyes: Conjunctivae are normal. Head: Atraumatic. Nose: No congestion/rhinnorhea. Mouth/Throat:  Mucous membranes are moist. Neck: Normal ROM Cardiovascular: Normal rate, regular rhythm. Grossly normal heart sounds. Respiratory: Normal respiratory effort.  No retractions. Lungs CTAB. Gastrointestinal: Soft and nontender. No distention. Genitourinary: deferred Musculoskeletal: No lower extremity tenderness nor edema. Neurologic:  Normal speech and language. No gross focal neurologic deficits are appreciated. Skin:  Skin is warm, dry and intact. No rash noted. Psychiatric: Mood and affect are normal. Speech and behavior are normal.  ____________________________________________   LABS (all labs ordered are listed, but only abnormal results are displayed)  Labs  Reviewed  BASIC METABOLIC PANEL - Abnormal; Notable for the following components:      Result Value   Glucose, Bld 113 (*)    All other components within normal limits  CBC - Abnormal; Notable for the following components:   Hemoglobin 12.5 (*)    HCT 36.6 (*)    All other components within normal limits  TROPONIN I (HIGH SENSITIVITY)  TROPONIN I (HIGH SENSITIVITY)   ____________________________________________  EKG  ED ECG REPORT I, Blake Divine, the attending physician, personally viewed and interpreted this ECG.   Date: 11/08/2019  EKG Time: 1:01  Rate: 70  Rhythm: normal sinus rhythm  Axis: Normal  Intervals:none  ST&T Change: None    PROCEDURES  Procedure(s) performed (including Critical Care):  Procedures   ____________________________________________   INITIAL IMPRESSION / ASSESSMENT AND PLAN / ED COURSE       63 year old male with history of hypertension presents to the ED complaining of fluctuating BP as well as some chest tightness and diffuse tingling last night.  He has no focal neurologic deficits, doubt stroke.  EKG without acute changes and creatinine within normal limits, no evidence of hypertensive emergency.  Chest tightness has since resolved, there may be an element of anxiety contributing to his symptoms.  His blood pressure is gradually improving after he took the dose of clonidine.  He has follow-up scheduled with his PCP later today, counseled to continue to monitor blood pressure and make any necessary adjustments to medications with PCP.  Do not feel any changes to his medications are warranted today.  Counseled patient to return to the ED for new or worsening symptoms, patient agrees with plan.      ____________________________________________   FINAL CLINICAL IMPRESSION(S) / ED DIAGNOSES  Final diagnoses:  Essential hypertension  Chest tightness     ED Discharge Orders    None       Note:  This document was prepared using  Dragon voice recognition software and may include unintentional dictation errors.   Blake Divine, MD 11/08/19 [phone]

## 2020-02-12 ENCOUNTER — Ambulatory Visit: Payer: BC Managed Care – PPO

## 2021-02-01 IMAGING — CR DG CHEST 2V
1 series · 2 of 2 positions shown · non-contrast
Comparison: None.

CLINICAL DATA: Chest pain, hypertension

EXAM:
CHEST - 2 VIEW

[Series 1: dg chest 2 view · 0.14mm/px · 2 of 2 slices shown]
[im 1/2]
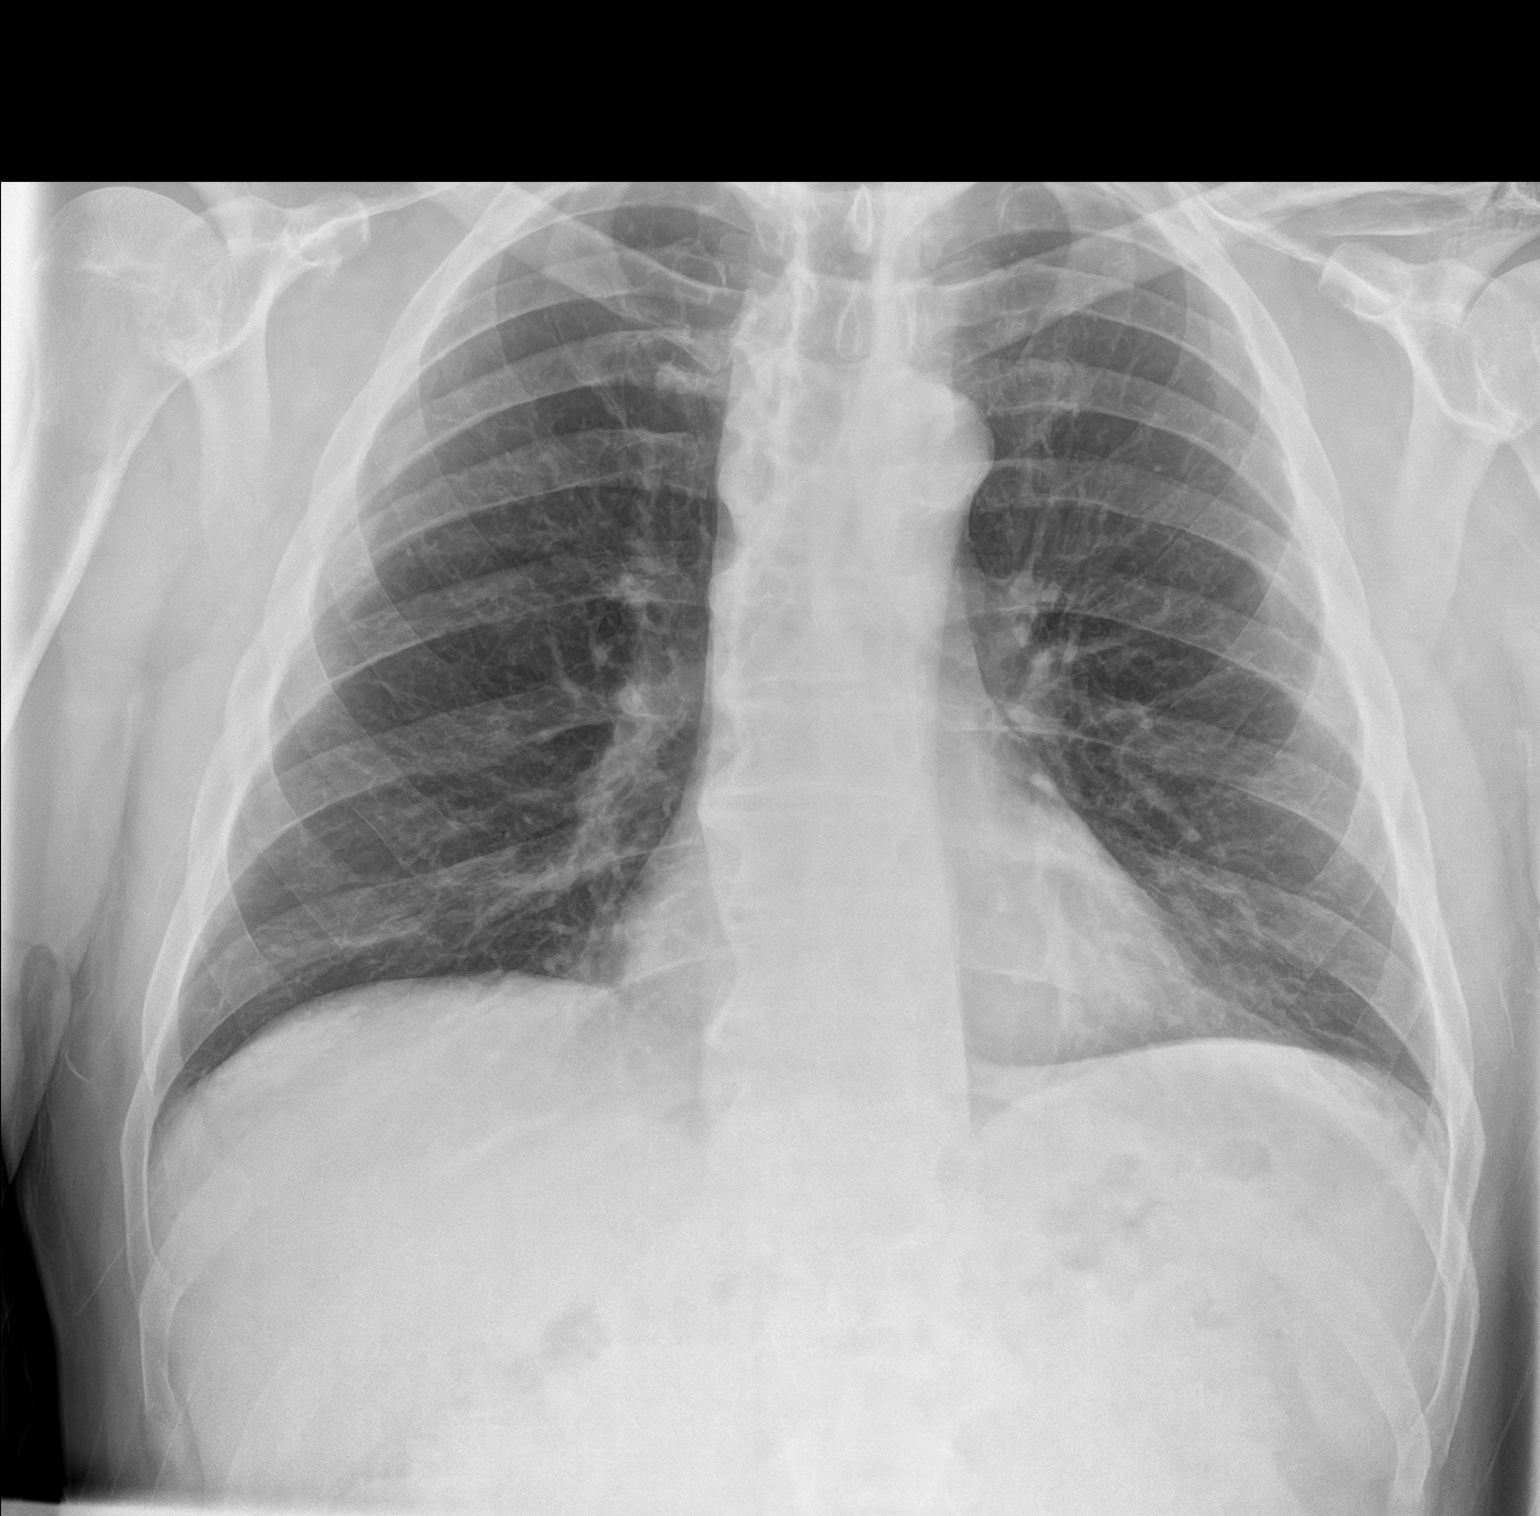
[im 2/2]
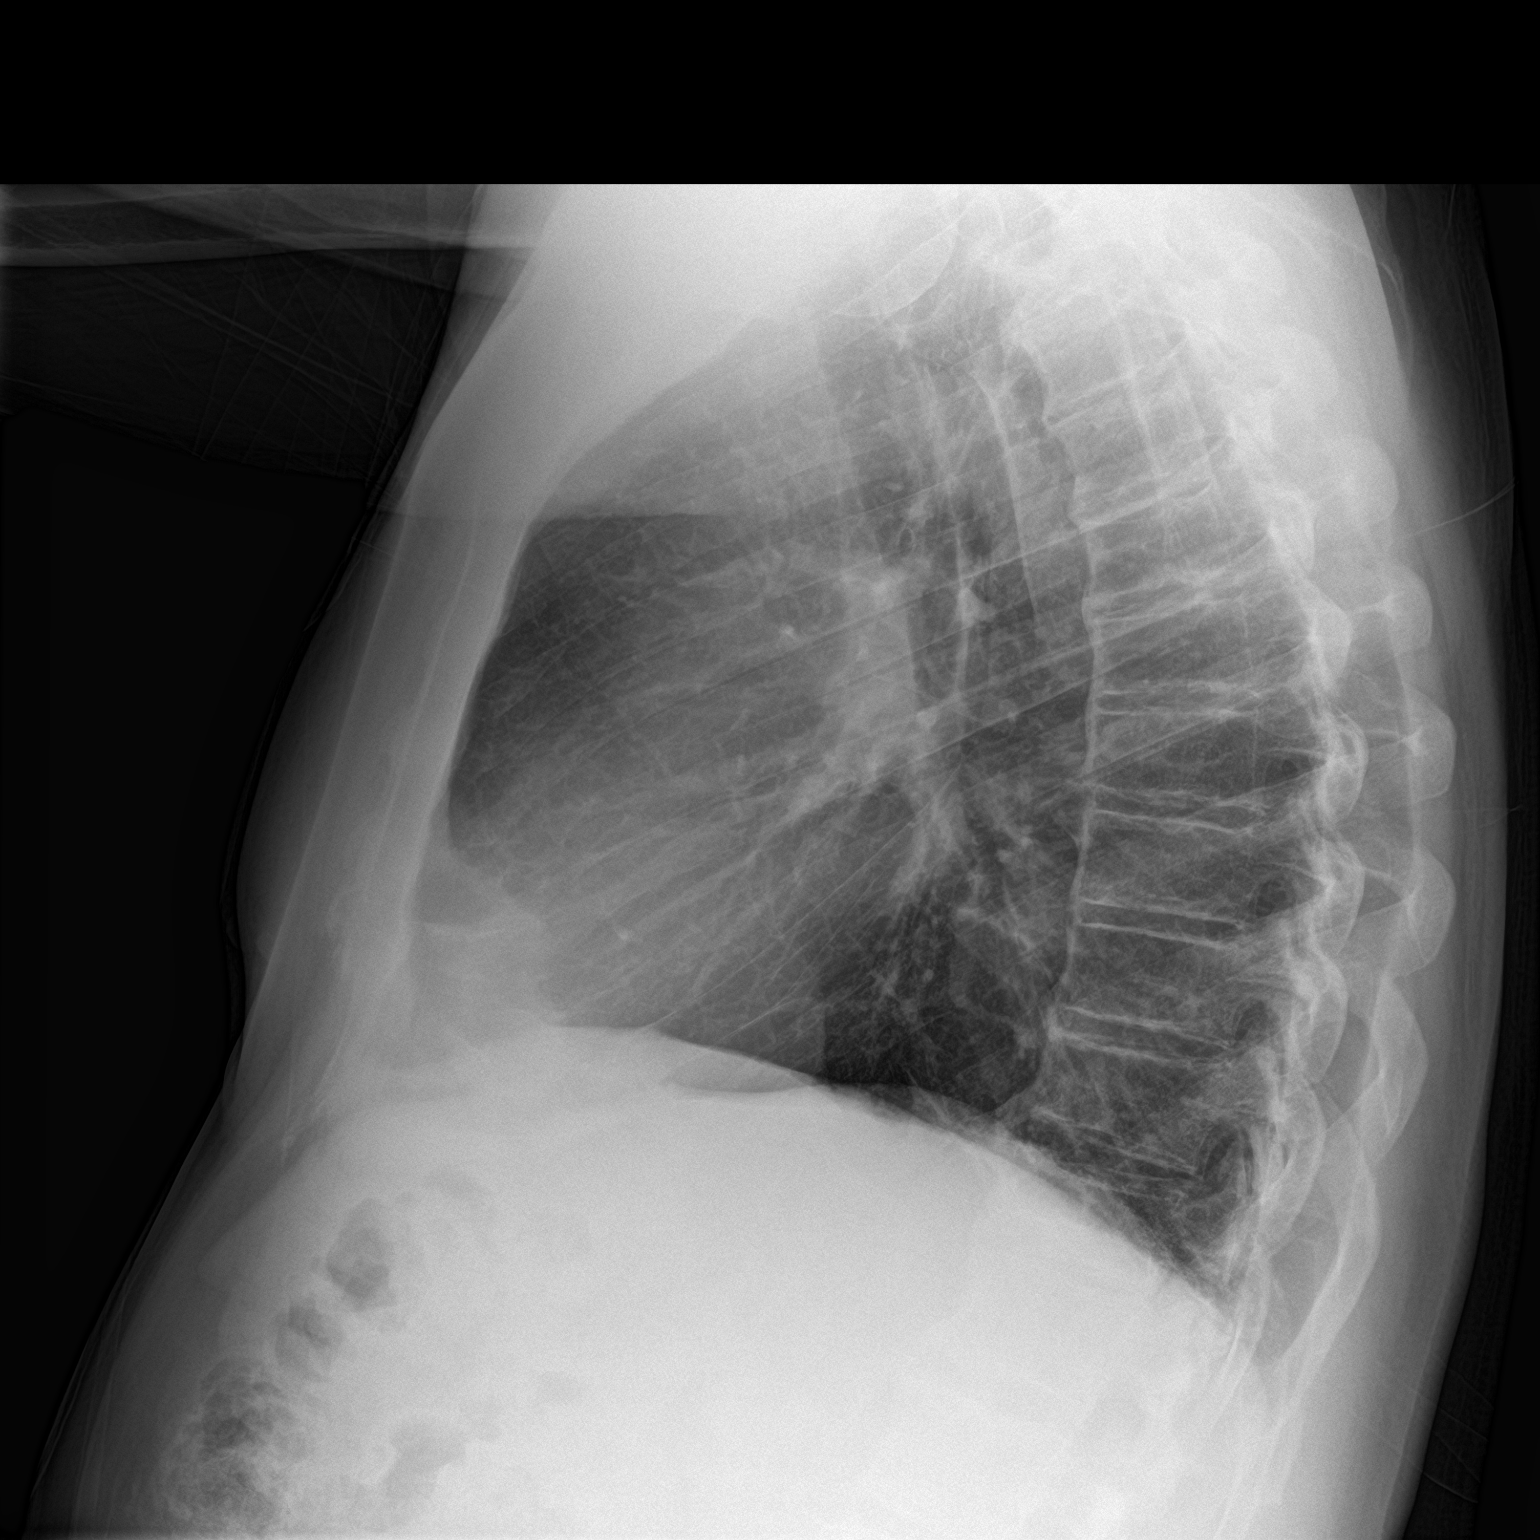

[2 of 2 positions shown; findings below may reference images not displayed]

FINDINGS: No consolidation, features of edema, pneumothorax, or effusion.
Pulmonary vascularity is normally distributed. The cardiomediastinal
contours are unremarkable. No acute osseous or soft tissue
abnormality. Degenerative changes are present in the imaged spine
and shoulders.
IMPRESSION: No acute cardiopulmonary abnormality.

## 2022-04-02 ENCOUNTER — Ambulatory Visit: Payer: Self-pay | Admitting: Dermatology

## 2022-07-22 ENCOUNTER — Encounter: Payer: Self-pay | Admitting: Internal Medicine

## 2022-07-23 ENCOUNTER — Telehealth: Payer: Self-pay

## 2022-07-23 ENCOUNTER — Other Ambulatory Visit: Payer: Self-pay

## 2022-07-23 DIAGNOSIS — Z1211 Encounter for screening for malignant neoplasm of colon: Secondary | ICD-10-CM

## 2022-07-23 MED ORDER — NA SULFATE-K SULFATE-MG SULF 17.5-3.13-1.6 GM/177ML PO SOLN: 1.0000 | Freq: Once | ORAL | 0 refills | Status: AC

## 2022-07-23 NOTE — Telephone Encounter (Signed)
Gastroenterology Pre-Procedure Review  Request Date: 08/22/22 Requesting Physician: Dr. Vicente Males  PATIENT REVIEW QUESTIONS: The patient responded to the following health history questions as indicated:    1. Are you having any GI issues? no 2. Do you have a personal history of Polyps? no 3. Do you have a family history of Colon Cancer or Polyps? no 4. Diabetes Mellitus? no 5. Joint replacements in the past 12 months?no 6. Major health problems in the past 3 months?no 7. Any artificial heart valves, MVP, or defibrillator?no    MEDICATIONS & ALLERGIES:    Patient reports the following regarding taking any anticoagulation/antiplatelet therapy:   Plavix, Coumadin, Eliquis, Xarelto, Lovenox, Pradaxa, Brilinta, or Effient? no Aspirin? yes (81 mg)  Patient confirms/reports the following medications:  Current Outpatient Medications  Medication Sig Dispense Refill   aspirin EC 81 MG EC tablet Take 1 tablet (81 mg total) by mouth daily. 30 tablet 0   lisinopril (PRINIVIL,ZESTRIL) 10 MG tablet      No current facility-administered medications for this visit.    Patient confirms/reports the following allergies:  No Known Allergies  No orders of the defined types were placed in this encounter.   AUTHORIZATION INFORMATION Primary Insurance: 1D#: Group #:  Secondary Insurance: 1D#: Group #:  SCHEDULE INFORMATION: Date: 08/22/22 Time: Location: [facility]

## 2022-08-21 ENCOUNTER — Encounter: Payer: Self-pay | Admitting: Gastroenterology

## 2022-08-22 ENCOUNTER — Ambulatory Visit: Payer: BC Managed Care – PPO

## 2022-08-22 ENCOUNTER — Encounter
Admission: RE | Disposition: A | Discharge status: Home / Self Care | Payer: Self-pay | Source: Home / Self Care | Attending: Gastroenterology

## 2022-08-22 ENCOUNTER — Encounter: Payer: Self-pay | Admitting: Gastroenterology

## 2022-08-22 ENCOUNTER — Ambulatory Visit
Admission: RE | Admit: 2022-08-22 | Discharge: 2022-08-22 | Disposition: A | Discharge status: Home / Self Care | Payer: BC Managed Care – PPO | Attending: Gastroenterology | Admitting: Gastroenterology

## 2022-08-22 DIAGNOSIS — D124 Benign neoplasm of descending colon: Secondary | ICD-10-CM | POA: Insufficient documentation

## 2022-08-22 DIAGNOSIS — K5730 Diverticulosis of large intestine without perforation or abscess without bleeding: Secondary | ICD-10-CM | POA: Insufficient documentation

## 2022-08-22 DIAGNOSIS — I10 Essential (primary) hypertension: Secondary | ICD-10-CM | POA: Diagnosis not present

## 2022-08-22 DIAGNOSIS — Z1211 Encounter for screening for malignant neoplasm of colon: Secondary | ICD-10-CM | POA: Diagnosis present

## 2022-08-22 DIAGNOSIS — D126 Benign neoplasm of colon, unspecified: Secondary | ICD-10-CM

## 2022-08-22 DIAGNOSIS — I2510 Atherosclerotic heart disease of native coronary artery without angina pectoris: Secondary | ICD-10-CM | POA: Diagnosis not present

## 2022-08-22 HISTORY — PX: COLONOSCOPY WITH PROPOFOL: SHX5780

## 2022-08-22 SURGERY — COLONOSCOPY WITH PROPOFOL
Anesthesia: General

## 2022-08-22 MED ORDER — PROPOFOL 10 MG/ML IV BOLUS
INTRAVENOUS | Status: DC | PRN
  Administered 2022-08-22: 40 mg via INTRAVENOUS
  Administered 2022-08-22: 60 mg via INTRAVENOUS

## 2022-08-22 MED ORDER — LIDOCAINE HCL (PF) 2 % IJ SOLN
INTRAMUSCULAR | Status: AC
  Filled 2022-08-22: qty 5

## 2022-08-22 MED ORDER — PROPOFOL 500 MG/50ML IV EMUL
INTRAVENOUS | Status: DC | PRN
  Administered 2022-08-22: 150 ug/kg/min via INTRAVENOUS

## 2022-08-22 MED ORDER — SODIUM CHLORIDE 0.9 % IV SOLN: INTRAVENOUS | Status: DC

## 2022-08-22 MED ORDER — LIDOCAINE HCL (CARDIAC) PF 100 MG/5ML IV SOSY
PREFILLED_SYRINGE | INTRAVENOUS | Status: DC | PRN
  Administered 2022-08-22: 60 mg via INTRAVENOUS

## 2022-08-22 MED ORDER — PROPOFOL 10 MG/ML IV BOLUS
INTRAVENOUS | Status: AC
  Filled 2022-08-22: qty 20

## 2022-08-22 NOTE — Op Note (Signed)
[facility] Patient Name: Benjamin Parker Procedure Date: 08/22/2022 8:54 AM MRN: [id number] Account #: [id number] Date of Birth: [birth year] Admit Type: Outpatient Age: 66 Room: [facility] ENDO ROOM 3 Gender: Male Note Status: Finalized Instrument Name: Jasper Riling [id number] Procedure:             Colonoscopy Indications:           Screening for colorectal malignant neoplasm Providers:             Jonathon Bellows MD, MD Referring MD:          Leona Carry. Hall Busing, MD (Referring MD) Medicines:             Monitored Anesthesia Care Complications:         No immediate complications. Procedure:             Pre-Anesthesia Assessment:                        - Prior to the procedure, a History and Physical was                         performed, and patient medications, allergies and                         sensitivities were reviewed. The patient's tolerance                         of previous anesthesia was reviewed.                        - The risks and benefits of the procedure and the                         sedation options and risks were discussed with the                         patient. All questions were answered and informed                         consent was obtained.                        - ASA Grade Assessment: II - A patient with mild                         systemic disease.                        After obtaining informed consent, the colonoscope was                         passed under direct vision. Throughout the procedure,                         the patient's blood pressure, pulse, and oxygen                         saturations were monitored continuously. The                         Colonoscope was  introduced through the anus and                         advanced to the the cecum, identified by the                         appendiceal orifice. The colonoscopy was performed                         with ease. The patient tolerated the procedure well.                          The quality of the bowel preparation was excellent. Findings:      The perianal and digital rectal examinations were normal.      Two sessile polyps were found in the descending colon. The polyps were 5       to 7 mm in size. These polyps were removed with a cold snare. Resection       and retrieval were complete. To prevent bleeding after the polypectomy,       two hemostatic clips were successfully placed. There was no bleeding at       the end of the procedure.      A 12 mm polyp was found in the descending colon. The polyp was       semi-pedunculated. Preparations were made for mucosal resection. Saline       was injected to raise the lesion. Snare mucosal resection was performed.       Resection and retrieval were complete. Borders of polyp marked using       blue or nbi light      Multiple small and large-mouthed diverticula were found in the sigmoid       colon.      The exam was otherwise without abnormality on direct and retroflexion       views. Impression:            - Two 5 to 7 mm polyps in the descending colon,                         removed with a cold snare. Resected and retrieved.                         Clips were placed.                        - One 12 mm polyp in the descending colon, removed                         with mucosal resection. Resected and retrieved.                        - Diverticulosis in the sigmoid colon.                        - The examination was otherwise normal on direct and                         retroflexion views.                        -  Mucosal resection was performed. Resection and                         retrieval were complete. Recommendation:        - Discharge patient to home (with escort).                        - Resume previous diet.                        - Continue present medications.                        - Await pathology results.                        - Repeat colonoscopy in 3 years for surveillance  based                         on pathology results. Procedure Code(s):     --- Professional ---                        [phone], Colonoscopy, flexible; with endoscopic mucosal                         resection                        [id number], 42, Colonoscopy, flexible; with removal of                         tumor(s), polyp(s), or other lesion(s) by snare                         technique Diagnosis Code(s):     --- Professional ---                        K63.5, Polyp of colon                        Z12.11, Encounter for screening for malignant neoplasm                         of colon                        K57.30, Diverticulosis of large intestine without                         perforation or abscess without bleeding CPT copyright 2019 American Medical Association. All rights reserved. The codes documented in this report are preliminary and upon coder review may  be revised to meet current compliance requirements. Jonathon Bellows, MD Jonathon Bellows MD, MD 08/22/2022 9:30:07 AM This report has been signed electronically. Number of Addenda: 0 Note Initiated On: 08/22/2022 8:54 AM Scope Withdrawal Time: 0 hours 15 minutes 36 seconds  Total Procedure Duration: 0 hours 17 minutes 54 seconds  Estimated Blood Loss:  Estimated blood loss: none.      [facility]

## 2022-08-22 NOTE — Transfer of Care (Signed)
Immediate Anesthesia Transfer of Care Note  Patient: Benjamin Parker  Procedure(s) Performed: COLONOSCOPY WITH PROPOFOL  Patient Location: PACU  Anesthesia Type:General  Level of Consciousness: drowsy  Airway & Oxygen Therapy: Patient Spontanous Breathing  Post-op Assessment: Report given to RN and Post -op Vital signs reviewed and stable  Post vital signs: Reviewed and stable  Last Vitals:  Vitals Value Taken Time  BP    Temp 35.8 C 08/22/22 0932  Pulse 65 08/22/22 0932  Resp 17 08/22/22 0932  SpO2 98 % 08/22/22 0932    Last Pain:  Vitals:   08/22/22 0932  TempSrc: Temporal  PainSc: Asleep         Complications: No notable events documented.

## 2022-08-22 NOTE — Anesthesia Preprocedure Evaluation (Signed)
Anesthesia Evaluation  Patient identified by MRN, date of birth, ID band Patient awake    Reviewed: Allergy & Precautions, NPO status , Patient's Chart, lab work & pertinent test results  History of Anesthesia Complications Negative for: history of anesthetic complications  Airway Mallampati: IV  TM Distance: >3 FB Neck ROM: full    Dental  (+) Dental Advidsory Given, Teeth Intact   Pulmonary neg pulmonary ROS, neg shortness of breath, neg sleep apnea, neg COPD,    Pulmonary exam normal        Cardiovascular hypertension, + CAD  (-) Past MI and (-) CABG Normal cardiovascular exam     Neuro/Psych negative neurological ROS  negative psych ROS   GI/Hepatic negative GI ROS, Neg liver ROS,   Endo/Other  negative endocrine ROS  Renal/GU negative Renal ROS  negative genitourinary   Musculoskeletal   Abdominal   Peds  Hematology negative hematology ROS (+)   Anesthesia Other Findings Past Medical History: No date: Hypertension  Past Surgical History: No date: COLONOSCOPY WITH PROPOFOL No date: HERNIA REPAIR  BMI    Body Mass Index: 28.70 kg/m      Reproductive/Obstetrics negative OB ROS                             Anesthesia Physical Anesthesia Plan  ASA: 2  Anesthesia Plan: General   Post-op Pain Management: Minimal or no pain anticipated   Induction: Intravenous  PONV Risk Score and Plan: 3 and Propofol infusion, TIVA and Ondansetron  Airway Management Planned: Nasal Cannula  Additional Equipment: None  Intra-op Plan:   Post-operative Plan:   Informed Consent: I have reviewed the patients History and Physical, chart, labs and discussed the procedure including the risks, benefits and alternatives for the proposed anesthesia with the patient or authorized representative who has indicated his/her understanding and acceptance.     Dental advisory given  Plan Discussed  with: CRNA and Surgeon  Anesthesia Plan Comments: (Discussed risks of anesthesia with patient, including possibility of difficulty with spontaneous ventilation under anesthesia necessitating airway intervention, PONV, and rare risks such as cardiac or respiratory or neurological events, and allergic reactions. Discussed the role of CRNA in patient's perioperative care. Patient understands.)        Anesthesia Quick Evaluation

## 2022-08-22 NOTE — H&P (Signed)
     Jonathon Bellows, MD [address], [place], [place], [place], [address] [address], [place], [place], [place], [address] Phone: [phone]  Fax: [phone]  Primary Care Physician:  Albina Billet, MD   Pre-Procedure History & Physical: HPI:  RYNELL CIOTTI is a 66 y.o. male is here for an colonoscopy.   Past Medical History:  Diagnosis Date   Hypertension     Past Surgical History:  Procedure Laterality Date   COLONOSCOPY WITH PROPOFOL     HERNIA REPAIR      Prior to Admission medications   Medication Sig Start Date End Date Taking? Authorizing Provider  aspirin EC 81 MG EC tablet Take 1 tablet (81 mg total) by mouth daily. 05/25/17   Loletha Grayer, MD  lisinopril (PRINIVIL,ZESTRIL) 10 MG tablet  02/14/17   [provider]    Allergies as of 07/23/2022   (No Known Allergies)    History reviewed. No pertinent family history.  Social History   Socioeconomic History   Marital status: Married    Spouse name: Not on file   Number of children: Not on file   Years of education: Not on file   Highest education level: Not on file  Occupational History   Not on file  Tobacco Use   Smoking status: Never   Smokeless tobacco: Never  Vaping Use   Vaping Use: Never used  Substance and Sexual Activity   Alcohol use: No   Drug use: No   Sexual activity: Not on file  Other Topics Concern   Not on file  Social History Narrative   Not on file   Social Determinants of Health   Financial Resource Strain: Not on file  Food Insecurity: Not on file  Transportation Needs: Not on file  Physical Activity: Not on file  Stress: Not on file  Social Connections: Not on file  Intimate Partner Violence: Not on file    Review of Systems: See HPI, otherwise negative ROS  Physical Exam: BP (!) 193/93   Pulse 84   Temp (!) 97.4 F (36.3 C) (Temporal)   Resp 18   Ht '5\' 10"'$  (1.778 m)   Wt 90.7 kg   SpO2 99%   BMI 28.70 kg/m  General:   Alert,   pleasant and cooperative in NAD Head:  Normocephalic and atraumatic. Neck:  Supple; no masses or thyromegaly. Lungs:  Clear throughout to auscultation, normal respiratory effort.    Heart:  +S1, +S2, Regular rate and rhythm, No edema. Abdomen:  Soft, nontender and nondistended. Normal bowel sounds, without guarding, and without rebound.   Neurologic:  Alert and  oriented x4;  grossly normal neurologically.  Impression/Plan: JAZZIEL FITZSIMMONS is here for an colonoscopy to be performed for Screening colonoscopy average risk   Risks, benefits, limitations, and alternatives regarding  colonoscopy have been reviewed with the patient.  Questions have been answered.  All parties agreeable.   Jonathon Bellows, MD  08/22/2022, 8:45 AM

## 2022-08-23 ENCOUNTER — Encounter: Payer: Self-pay | Admitting: Gastroenterology

## 2022-08-23 LAB — SURGICAL PATHOLOGY

## 2022-08-23 NOTE — Anesthesia Postprocedure Evaluation (Signed)
Anesthesia Post Note  Patient: Benjamin Parker  Procedure(s) Performed: COLONOSCOPY WITH PROPOFOL  Patient location during evaluation: Endoscopy Anesthesia Type: General Level of consciousness: awake and alert Pain management: pain level controlled Vital Signs Assessment: post-procedure vital signs reviewed and stable Respiratory status: spontaneous breathing, nonlabored ventilation, respiratory function stable and patient connected to nasal cannula oxygen Cardiovascular status: blood pressure returned to baseline and stable Postop Assessment: no apparent nausea or vomiting Anesthetic complications: no   No notable events documented.   Last Vitals:  Vitals:   08/22/22 0942 08/22/22 0952  BP: 137/75 (!) 158/80  Pulse: 64 60  Resp: 16 (!) 22  Temp:    SpO2: 100% 100%    Last Pain:  Vitals:   08/22/22 0952  TempSrc:   PainSc: 0-No pain                 Dimas Millin

## 2022-08-26 ENCOUNTER — Encounter: Payer: Self-pay | Admitting: Gastroenterology

## 2022-09-02 ENCOUNTER — Encounter: Payer: Self-pay | Admitting: Ophthalmology

## 2022-09-05 NOTE — Anesthesia Preprocedure Evaluation (Addendum)
Anesthesia Evaluation  Patient identified by MRN, date of birth, ID band Patient awake    Reviewed: Allergy & Precautions, NPO status , Patient's Chart, lab work & pertinent test results, reviewed documented beta blocker date and time   History of Anesthesia Complications Negative for: history of anesthetic complications  Airway Mallampati: III  TM Distance: >3 FB Neck ROM: full    Dental  (+) Dental Advidsory Given, Teeth Intact   Pulmonary neg pulmonary ROS, neg shortness of breath, neg sleep apnea, neg COPD,    Pulmonary exam normal        Cardiovascular Exercise Tolerance: Good hypertension, Pt. on medications and Pt. on home beta blockers + CAD  (-) Past MI and (-) CABG Normal cardiovascular exam     Neuro/Psych Ocular migraine 2018 negative psych ROS   GI/Hepatic negative GI ROS, Neg liver ROS,   Endo/Other  negative endocrine ROS  Renal/GU negative Renal ROS  negative genitourinary   Musculoskeletal   Abdominal Normal abdominal exam  (+)   Peds  Hematology negative hematology ROS (+)   Anesthesia Other Findings Pt reports white coat hypertension with repeated elevated blood pressures when arriving to doctors visits or procedures. He took his medications today for which he states normal keep him controlled with a range 140-160. Chart review shows BP rang 160-180. No end-organ damage symptoms.    Past Medical History: No date: Hypertension  Past Surgical History: No date: COLONOSCOPY WITH PROPOFOL No date: HERNIA REPAIR  BMI    Body Mass Index: 28.70 kg/m      Reproductive/Obstetrics negative OB ROS                            Anesthesia Physical  Anesthesia Plan  ASA: 3  Anesthesia Plan: General   Post-op Pain Management: Minimal or no pain anticipated   Induction: Intravenous  PONV Risk Score and Plan: 3 and Propofol infusion, TIVA and Ondansetron  Airway  Management Planned: Nasal Cannula  Additional Equipment: None  Intra-op Plan:   Post-operative Plan:   Informed Consent: I have reviewed the patients History and Physical, chart, labs and discussed the procedure including the risks, benefits and alternatives for the proposed anesthesia with the patient or authorized representative who has indicated his/her understanding and acceptance.       Plan Discussed with: CRNA and Surgeon  Anesthesia Plan Comments:        Anesthesia Quick Evaluation

## 2022-09-05 NOTE — Discharge Instructions (Signed)

## 2022-09-10 ENCOUNTER — Other Ambulatory Visit: Payer: Self-pay

## 2022-09-10 ENCOUNTER — Encounter
Admission: RE | Disposition: A | Discharge status: Home / Self Care | Payer: Self-pay | Source: Home / Self Care | Attending: Ophthalmology

## 2022-09-10 ENCOUNTER — Ambulatory Visit: Payer: BC Managed Care – PPO | Admitting: Anesthesiology

## 2022-09-10 ENCOUNTER — Ambulatory Visit
Admission: RE | Admit: 2022-09-10 | Discharge: 2022-09-10 | Disposition: A | Discharge status: Home / Self Care | Payer: BC Managed Care – PPO | Attending: Ophthalmology | Admitting: Ophthalmology

## 2022-09-10 ENCOUNTER — Encounter: Payer: Self-pay | Admitting: Ophthalmology

## 2022-09-10 DIAGNOSIS — I10 Essential (primary) hypertension: Secondary | ICD-10-CM | POA: Diagnosis not present

## 2022-09-10 DIAGNOSIS — Z79899 Other long term (current) drug therapy: Secondary | ICD-10-CM | POA: Diagnosis not present

## 2022-09-10 DIAGNOSIS — I2510 Atherosclerotic heart disease of native coronary artery without angina pectoris: Secondary | ICD-10-CM | POA: Insufficient documentation

## 2022-09-10 DIAGNOSIS — H2511 Age-related nuclear cataract, right eye: Secondary | ICD-10-CM | POA: Diagnosis not present

## 2022-09-10 HISTORY — DX: Gout, unspecified: M10.9

## 2022-09-10 HISTORY — PX: CATARACT EXTRACTION W/PHACO: SHX586

## 2022-09-10 HISTORY — DX: Unspecified osteoarthritis, unspecified site: M19.90

## 2022-09-10 SURGERY — PHACOEMULSIFICATION, CATARACT, WITH IOL INSERTION
Anesthesia: Monitor Anesthesia Care | Site: Eye | Laterality: Right

## 2022-09-10 MED ORDER — LABETALOL HCL 5 MG/ML IV SOLN
10.0000 mg | Freq: Once | INTRAVENOUS | Status: AC
  Administered 2022-09-10: 10 mg via INTRAVENOUS

## 2022-09-10 MED ORDER — MOXIFLOXACIN HCL 0.5 % OP SOLN
OPHTHALMIC | Status: DC | PRN
  Administered 2022-09-10: 0.2 mL via OPHTHALMIC

## 2022-09-10 MED ORDER — HYDRALAZINE HCL 20 MG/ML IJ SOLN
5.0000 mg | Freq: Once | INTRAMUSCULAR | Status: AC
  Administered 2022-09-10: 5 mg via INTRAVENOUS

## 2022-09-10 MED ORDER — SIGHTPATH DOSE#1 BSS IO SOLN
INTRAOCULAR | Status: DC | PRN
  Administered 2022-09-10: 15 mL

## 2022-09-10 MED ORDER — SIGHTPATH DOSE#1 BSS IO SOLN
INTRAOCULAR | Status: DC | PRN
  Administered 2022-09-10: 1 mL via INTRAMUSCULAR

## 2022-09-10 MED ORDER — ARMC OPHTHALMIC DILATING DROPS
1.0000 | OPHTHALMIC | Status: AC | PRN
  Administered 2022-09-10 (×3): 1 via OPHTHALMIC

## 2022-09-10 MED ORDER — SIGHTPATH DOSE#1 BSS IO SOLN
INTRAOCULAR | Status: DC | PRN
  Administered 2022-09-10: 64 mL via OPHTHALMIC

## 2022-09-10 MED ORDER — BRIMONIDINE TARTRATE-TIMOLOL 0.2-0.5 % OP SOLN
OPHTHALMIC | Status: DC | PRN
  Administered 2022-09-10: 1 [drp] via OPHTHALMIC

## 2022-09-10 MED ORDER — TETRACAINE HCL 0.5 % OP SOLN
1.0000 [drp] | OPHTHALMIC | Status: AC | PRN
  Administered 2022-09-10 (×3): 1 [drp] via OPHTHALMIC

## 2022-09-10 MED ORDER — FENTANYL CITRATE (PF) 100 MCG/2ML IJ SOLN
INTRAMUSCULAR | Status: DC | PRN
  Administered 2022-09-10: 100 ug via INTRAVENOUS

## 2022-09-10 MED ORDER — MIDAZOLAM HCL 2 MG/2ML IJ SOLN
INTRAMUSCULAR | Status: DC | PRN
  Administered 2022-09-10: 2 mg via INTRAVENOUS

## 2022-09-10 MED ORDER — SIGHTPATH DOSE#1 NA CHONDROIT SULF-NA HYALURON 40-17 MG/ML IO SOLN
INTRAOCULAR | Status: DC | PRN
  Administered 2022-09-10: 1 mL via INTRAOCULAR

## 2022-09-10 SURGICAL SUPPLY — 11 items
CATARACT SUITE SIGHTPATH (MISCELLANEOUS) ×1 IMPLANT
FEE CATARACT SUITE SIGHTPATH (MISCELLANEOUS) ×1 IMPLANT
GLOVE SURG ENC TEXT LTX SZ8 (GLOVE) ×1 IMPLANT
GLOVE SURG TRIUMPH 8.0 PF LTX (GLOVE) ×1 IMPLANT
LENS IOL ACRSF IQ VT 15 19.0 IMPLANT
LENS IOL ACRYSOF VIVITY 19.0 ×1 IMPLANT
LENS IOL VIVITY 015 19.0 ×1 IMPLANT
NDL FILTER BLUNT 18X1 1/2 (NEEDLE) ×1 IMPLANT
NEEDLE FILTER BLUNT 18X1 1/2 (NEEDLE) ×1 IMPLANT
SYR 3ML LL SCALE MARK (SYRINGE) ×1 IMPLANT
WATER STERILE IRR 250ML POUR (IV SOLUTION) ×1 IMPLANT

## 2022-09-10 NOTE — Op Note (Signed)
PREOPERATIVE DIAGNOSIS:  Nuclear sclerotic cataract of the right eye.   POSTOPERATIVE DIAGNOSIS:  H25.11 Cataract   OPERATIVE PROCEDURE:ORPROCALL@   SURGEON:  Birder Robson, MD.   ANESTHESIA:  Anesthesiologist: Iran Ouch, MD CRNA: Tobie Poet, CRNA  1.      Managed anesthesia care. 2.      0.4m of Shugarcaine was instilled in the eye following the paracentesis.   COMPLICATIONS:  None.   TECHNIQUE:   Stop and chop   DESCRIPTION OF PROCEDURE:  The patient was examined and consented in the preoperative holding area where the aforementioned topical anesthesia was applied to the right eye and then brought back to the Operating Room where the right eye was prepped and draped in the usual sterile ophthalmic fashion and a lid speculum was placed. A paracentesis was created with the side port blade and the anterior chamber was filled with viscoelastic. A near clear corneal incision was performed with the steel keratome. A continuous curvilinear capsulorrhexis was performed with a cystotome followed by the capsulorrhexis forceps. Hydrodissection and hydrodelineation were carried out with BSS on a blunt cannula. The lens was removed in a stop and chop  technique and the remaining cortical material was removed with the irrigation-aspiration handpiece. The capsular bag was inflated with viscoelastic and the Technis [id number]  lens was placed in the capsular bag without complication. The remaining viscoelastic was removed from the eye with the irrigation-aspiration handpiece. The wounds were hydrated. The anterior chamber was flushed with BSS and the eye was inflated to physiologic pressure. 0.164mof Vigamox was placed in the anterior chamber. The wounds were found to be water tight. The eye was dressed with Combigan. The patient was given protective glasses to wear throughout the day and a shield with which to sleep tonight. The patient was also given drops with which to begin a drop  regimen today and will follow-up with me in one day. Implant Name Type Inv. Item Serial No. Manufacturer Lot No. LRB No. Used Action  LENS IOL ACRYSOF VIVITY 19.0 - [id number] IOL ACRYSOF VIVITY 19.0 [id number]  Right 1 Implanted   Procedure(s): CATARACT EXTRACTION PHACO AND INTRAOCULAR LENS PLACEMENT (IOC) RIGHT 7.80 00:41.3 (Right)  Electronically signed: WiBirder Robson/26/2023 8:49 AM

## 2022-09-10 NOTE — Anesthesia Postprocedure Evaluation (Signed)
Anesthesia Post Note  Patient: Benjamin Parker  Procedure(s) Performed: CATARACT EXTRACTION PHACO AND INTRAOCULAR LENS PLACEMENT (IOC) RIGHT 7.80 00:41.3 (Right: Eye)     Patient location during evaluation: PACU Anesthesia Type: MAC Level of consciousness: awake and alert Pain management: pain level controlled Vital Signs Assessment: post-procedure vital signs reviewed and stable Respiratory status: spontaneous breathing, nonlabored ventilation and respiratory function stable Cardiovascular status: blood pressure returned to baseline and stable Postop Assessment: no apparent nausea or vomiting Anesthetic complications: no   No notable events documented.  Iran Ouch

## 2022-09-10 NOTE — Transfer of Care (Signed)
Immediate Anesthesia Transfer of Care Note  Patient: Benjamin Parker  Procedure(s) Performed: CATARACT EXTRACTION PHACO AND INTRAOCULAR LENS PLACEMENT (IOC) RIGHT 7.80 00:41.3 (Right: Eye)  Patient Location: PACU  Anesthesia Type: MAC  Level of Consciousness: awake, alert  and patient cooperative  Airway and Oxygen Therapy: Patient Spontanous Breathing and Patient connected to supplemental oxygen  Post-op Assessment: Post-op Vital signs reviewed, Patient's Cardiovascular Status Stable, Respiratory Function Stable, Patent Airway and No signs of Nausea or vomiting  Post-op Vital Signs: Reviewed and stable  Complications: No notable events documented.

## 2022-09-10 NOTE — H&P (Signed)
[facility]   Primary Care Physician:  Albina Billet, MD Ophthalmologist: Dr. George Ina  Pre-Procedure History & Physical: HPI:  Benjamin Parker is a 66 y.o. male here for cataract surgery.   Past Medical History:  Diagnosis Date   Arthritis    Gout    Hypertension     Past Surgical History:  Procedure Laterality Date   COLONOSCOPY WITH PROPOFOL     COLONOSCOPY WITH PROPOFOL N/A 08/22/2022   Procedure: COLONOSCOPY WITH PROPOFOL;  Surgeon: Jonathon Bellows, MD;  Location: [facility] ENDOSCOPY;  Service: Gastroenterology;  Laterality: N/A;   HERNIA REPAIR      Prior to Admission medications   Medication Sig Start Date End Date Taking? Authorizing Provider  fexofenadine (ALLEGRA) 60 MG tablet Take 60 mg by mouth daily.   Yes [provider]  lisinopril (PRINIVIL,ZESTRIL) 10 MG tablet Take 20 mg by mouth in the morning and at bedtime. 02/14/17  Yes [provider]  metoprolol tartrate (LOPRESSOR) 50 MG tablet Take 75 mg by mouth 2 (two) times daily.   Yes [provider]  Misc Natural Products (BLACK CHERRY CONCENTRATE PO) Take by mouth daily.   Yes [provider]  rosuvastatin (CRESTOR) 10 MG tablet Take 10 mg by mouth at bedtime.   Yes [provider]  aspirin EC 81 MG EC tablet Take 1 tablet (81 mg total) by mouth daily. Patient not taking: Reported on 09/02/2022 05/25/17   Loletha Grayer, MD    Allergies as of 07/29/2022   (No Known Allergies)    History reviewed. No pertinent family history.  Social History   Socioeconomic History   Marital status: Married    Spouse name: Not on file   Number of children: Not on file   Years of education: Not on file   Highest education level: Not on file  Occupational History   Not on file  Tobacco Use   Smoking status: Never   Smokeless tobacco: Never  Vaping Use   Vaping Use: Never used  Substance and Sexual Activity   Alcohol use: No   Drug use: No   Sexual activity: Not on file   Other Topics Concern   Not on file  Social History Narrative   Not on file   Social Determinants of Health   Financial Resource Strain: Not on file  Food Insecurity: Not on file  Transportation Needs: Not on file  Physical Activity: Not on file  Stress: Not on file  Social Connections: Not on file  Intimate Partner Violence: Not on file    Review of Systems: See HPI, otherwise negative ROS  Physical Exam: BP (!) 216/104 Comment: MD aware  Pulse 72   Temp 98.1 F (36.7 C) (Temporal)   Ht '5\' 10"'$  (1.778 m)   Wt 93.4 kg   SpO2 96%   BMI 29.56 kg/m  General:   Alert, cooperative in NAD Head:  Normocephalic and atraumatic. Respiratory:  Normal work of breathing. Cardiovascular:  RRR  Impression/Plan: Benjamin Parker is here for cataract surgery.  Risks, benefits, limitations, and alternatives regarding cataract surgery have been reviewed with the patient.  Questions have been answered.  All parties agreeable.   Birder Robson, MD  09/10/2022, 8:21 AM

## 2022-09-11 ENCOUNTER — Encounter: Payer: Self-pay | Admitting: Ophthalmology

## 2022-09-16 ENCOUNTER — Encounter: Payer: Self-pay | Admitting: Ophthalmology

## 2022-09-20 NOTE — Discharge Instructions (Signed)

## 2022-09-24 ENCOUNTER — Ambulatory Visit: Payer: Medicare Other | Admitting: Anesthesiology

## 2022-09-24 ENCOUNTER — Ambulatory Visit
Admission: RE | Admit: 2022-09-24 | Discharge: 2022-09-24 | Disposition: A | Discharge status: Home / Self Care | Payer: Medicare Other | Attending: Ophthalmology | Admitting: Ophthalmology

## 2022-09-24 ENCOUNTER — Encounter
Admission: RE | Disposition: A | Discharge status: Home / Self Care | Payer: Self-pay | Source: Home / Self Care | Attending: Ophthalmology

## 2022-09-24 DIAGNOSIS — H2512 Age-related nuclear cataract, left eye: Secondary | ICD-10-CM | POA: Insufficient documentation

## 2022-09-24 DIAGNOSIS — M1990 Unspecified osteoarthritis, unspecified site: Secondary | ICD-10-CM | POA: Insufficient documentation

## 2022-09-24 DIAGNOSIS — I10 Essential (primary) hypertension: Secondary | ICD-10-CM | POA: Diagnosis not present

## 2022-09-24 DIAGNOSIS — M109 Gout, unspecified: Secondary | ICD-10-CM | POA: Insufficient documentation

## 2022-09-24 HISTORY — PX: CATARACT EXTRACTION W/PHACO: SHX586

## 2022-09-24 SURGERY — PHACOEMULSIFICATION, CATARACT, WITH IOL INSERTION
Anesthesia: Monitor Anesthesia Care | Site: Eye | Laterality: Left

## 2022-09-24 MED ORDER — MOXIFLOXACIN HCL 0.5 % OP SOLN
OPHTHALMIC | Status: DC | PRN
  Administered 2022-09-24: 0.2 mL via OPHTHALMIC

## 2022-09-24 MED ORDER — SIGHTPATH DOSE#1 BSS IO SOLN
INTRAOCULAR | Status: DC | PRN
  Administered 2022-09-24: 15 mL

## 2022-09-24 MED ORDER — ARMC OPHTHALMIC DILATING DROPS
1.0000 | OPHTHALMIC | Status: DC | PRN
  Administered 2022-09-24 (×3): 1 via OPHTHALMIC

## 2022-09-24 MED ORDER — BRIMONIDINE TARTRATE-TIMOLOL 0.2-0.5 % OP SOLN
OPHTHALMIC | Status: DC | PRN
  Administered 2022-09-24: 1 [drp] via OPHTHALMIC

## 2022-09-24 MED ORDER — MIDAZOLAM HCL 2 MG/2ML IJ SOLN
INTRAMUSCULAR | Status: DC | PRN
  Administered 2022-09-24: 1 mg via INTRAVENOUS

## 2022-09-24 MED ORDER — TETRACAINE HCL 0.5 % OP SOLN
1.0000 [drp] | OPHTHALMIC | Status: DC | PRN
  Administered 2022-09-24 (×3): 1 [drp] via OPHTHALMIC

## 2022-09-24 MED ORDER — LACTATED RINGERS IV SOLN: INTRAVENOUS | Status: DC

## 2022-09-24 MED ORDER — SIGHTPATH DOSE#1 NA CHONDROIT SULF-NA HYALURON 40-17 MG/ML IO SOLN
INTRAOCULAR | Status: DC | PRN
  Administered 2022-09-24: 1 mL via INTRAOCULAR

## 2022-09-24 MED ORDER — FENTANYL CITRATE (PF) 100 MCG/2ML IJ SOLN
INTRAMUSCULAR | Status: DC | PRN
  Administered 2022-09-24: 50 ug via INTRAVENOUS

## 2022-09-24 MED ORDER — SIGHTPATH DOSE#1 BSS IO SOLN
INTRAOCULAR | Status: DC | PRN
  Administered 2022-09-24: 72 mL via OPHTHALMIC

## 2022-09-24 MED ORDER — HYDRALAZINE HCL 20 MG/ML IJ SOLN
10.0000 mg | Freq: Once | INTRAMUSCULAR | Status: AC
  Administered 2022-09-24: 10 mg via INTRAVENOUS

## 2022-09-24 MED ORDER — SIGHTPATH DOSE#1 BSS IO SOLN
INTRAOCULAR | Status: DC | PRN
  Administered 2022-09-24: 1 mL via INTRAMUSCULAR

## 2022-09-24 SURGICAL SUPPLY — 11 items
CATARACT SUITE SIGHTPATH (MISCELLANEOUS) ×1 IMPLANT
FEE CATARACT SUITE SIGHTPATH (MISCELLANEOUS) ×1 IMPLANT
GLOVE SURG ENC TEXT LTX SZ8 (GLOVE) ×1 IMPLANT
GLOVE SURG TRIUMPH 8.0 PF LTX (GLOVE) ×1 IMPLANT
LENS IOL ACRSF IQ VT 15 19.5 IMPLANT
LENS IOL ACRYSOF VIVITY 19.5 ×1 IMPLANT
LENS IOL VIVITY 015 19.5 ×1 IMPLANT
NDL FILTER BLUNT 18X1 1/2 (NEEDLE) ×1 IMPLANT
NEEDLE FILTER BLUNT 18X1 1/2 (NEEDLE) ×1 IMPLANT
SYR 3ML LL SCALE MARK (SYRINGE) ×1 IMPLANT
WATER STERILE IRR 250ML POUR (IV SOLUTION) ×1 IMPLANT

## 2022-09-24 NOTE — Transfer of Care (Signed)
Immediate Anesthesia Transfer of Care Note  Patient: Benjamin Parker  Procedure(s) Performed: CATARACT EXTRACTION PHACO AND INTRAOCULAR LENS PLACEMENT (IOC) LEFT (Left: Eye)  Patient Location: PACU  Anesthesia Type: MAC  Level of Consciousness: awake, alert  and patient cooperative  Airway and Oxygen Therapy: Patient Spontanous Breathing and Patient connected to supplemental oxygen  Post-op Assessment: Post-op Vital signs reviewed, Patient's Cardiovascular Status Stable, Respiratory Function Stable, Patent Airway and No signs of Nausea or vomiting  Post-op Vital Signs: Reviewed and stable  Complications: No notable events documented.

## 2022-09-24 NOTE — H&P (Signed)
[facility]   Primary Care Physician:  Albina Billet, MD Ophthalmologist: Dr. George Ina  Pre-Procedure History & Physical: HPI:  Benjamin Parker is a 66 y.o. male here for cataract surgery.   Past Medical History:  Diagnosis Date   Arthritis    Gout    Hypertension     Past Surgical History:  Procedure Laterality Date   CATARACT EXTRACTION W/PHACO Right 09/10/2022   Procedure: CATARACT EXTRACTION PHACO AND INTRAOCULAR LENS PLACEMENT (IOC) RIGHT 7.80 00:41.3;  Surgeon: Birder Robson, MD;  Location: [facility];  Service: Ophthalmology;  Laterality: Right;   COLONOSCOPY WITH PROPOFOL     COLONOSCOPY WITH PROPOFOL N/A 08/22/2022   Procedure: COLONOSCOPY WITH PROPOFOL;  Surgeon: Jonathon Bellows, MD;  Location: [facility] ENDOSCOPY;  Service: Gastroenterology;  Laterality: N/A;   HERNIA REPAIR      Prior to Admission medications   Medication Sig Start Date End Date Taking? Authorizing Provider  fexofenadine (ALLEGRA) 60 MG tablet Take 60 mg by mouth daily.   Yes [provider]  lisinopril (PRINIVIL,ZESTRIL) 10 MG tablet Take 20 mg by mouth in the morning and at bedtime. 02/14/17  Yes [provider]  metoprolol tartrate (LOPRESSOR) 50 MG tablet Take 75 mg by mouth 2 (two) times daily.   Yes [provider]  Misc Natural Products (BLACK CHERRY CONCENTRATE PO) Take by mouth daily.   Yes [provider]  rosuvastatin (CRESTOR) 10 MG tablet Take 10 mg by mouth at bedtime.   Yes [provider]  aspirin EC 81 MG EC tablet Take 1 tablet (81 mg total) by mouth daily. Patient not taking: Reported on 09/02/2022 05/25/17   Loletha Grayer, MD    Allergies as of 07/29/2022   (No Known Allergies)    History reviewed. No pertinent family history.  Social History   Socioeconomic History   Marital status: Married    Spouse name: Not on file   Number of children: Not on file   Years of education: Not on file   Highest education level: Not  on file  Occupational History   Not on file  Tobacco Use   Smoking status: Never   Smokeless tobacco: Never  Vaping Use   Vaping Use: Never used  Substance and Sexual Activity   Alcohol use: No   Drug use: No   Sexual activity: Not on file  Other Topics Concern   Not on file  Social History Narrative   Not on file   Social Determinants of Health   Financial Resource Strain: Not on file  Food Insecurity: Not on file  Transportation Needs: Not on file  Physical Activity: Not on file  Stress: Not on file  Social Connections: Not on file  Intimate Partner Violence: Not on file    Review of Systems: See HPI, otherwise negative ROS  Physical Exam: BP (!) 176/76   Pulse 75   Temp 98.4 F (36.9 C) (Temporal)   Resp 16   Ht '5\' 10"'$  (1.778 m)   Wt 91.2 kg   SpO2 99%   BMI 28.84 kg/m  General:   Alert, cooperative in NAD Head:  Normocephalic and atraumatic. Respiratory:  Normal work of breathing. Cardiovascular:  RRR  Impression/Plan: Benjamin Parker is here for cataract surgery.  Risks, benefits, limitations, and alternatives regarding cataract surgery have been reviewed with the patient.  Questions have been answered.  All parties agreeable.   Birder Robson, MD  09/24/2022, 1:02 PM

## 2022-09-24 NOTE — Anesthesia Preprocedure Evaluation (Addendum)
Anesthesia Evaluation  Patient identified by MRN, date of birth, ID band Patient awake    Reviewed: Allergy & Precautions, NPO status , Patient's Chart, lab work & pertinent test results  History of Anesthesia Complications Negative for: history of anesthetic complications  Airway Mallampati: III  TM Distance: <3 FB Neck ROM: full    Dental  (+) Chipped   Pulmonary neg pulmonary ROS, neg shortness of breath,    Pulmonary exam normal        Cardiovascular Exercise Tolerance: Good hypertension, On Medications and On Home Beta Blockers (-) anginaNormal cardiovascular exam     Neuro/Psych TIAnegative psych ROS   GI/Hepatic negative GI ROS, Neg liver ROS, neg GERD  ,  Endo/Other  negative endocrine ROS  Renal/GU      Musculoskeletal   Abdominal   Peds  Hematology negative hematology ROS (+)   Anesthesia Other Findings Past Medical History: No date: Arthritis No date: Gout No date: Hypertension  Past Surgical History: 09/10/2022: CATARACT EXTRACTION W/PHACO; Right     Comment:  Procedure: CATARACT EXTRACTION PHACO AND INTRAOCULAR               LENS PLACEMENT (IOC) RIGHT 7.80 00:41.3;  Surgeon:               Birder Robson, MD;  Location: [employer] [facility];                Service: Ophthalmology;  Laterality: Right; No date: COLONOSCOPY WITH PROPOFOL 08/22/2022: COLONOSCOPY WITH PROPOFOL; N/A     Comment:  Procedure: COLONOSCOPY WITH PROPOFOL;  Surgeon: Jonathon Bellows, MD;  Location: [facility] ENDOSCOPY;  Service:               Gastroenterology;  Laterality: N/A; No date: HERNIA REPAIR  BMI    Body Mass Index: 28.84 kg/m      Reproductive/Obstetrics negative OB ROS                             Anesthesia Physical Anesthesia Plan  ASA: 3  Anesthesia Plan: MAC   Post-op Pain Management:    Induction: Intravenous  PONV Risk Score and Plan:   Airway Management  Planned: Natural Airway and Nasal Cannula  Additional Equipment:   Intra-op Plan:   Post-operative Plan:   Informed Consent: I have reviewed the patients History and Physical, chart, labs and discussed the procedure including the risks, benefits and alternatives for the proposed anesthesia with the patient or authorized representative who has indicated his/her understanding and acceptance.     Dental Advisory Given  Plan Discussed with: Anesthesiologist, CRNA and Surgeon  Anesthesia Plan Comments: (Patient presented with elevated blood pressure.  No symptoms of hypertension at this time.  Spoke with patient and his significant other concerning his elevated blood pressure, that we plan to treat it for better control preOp for the procedure but that he needs to follow up with his PCP for better long term control.  Patient voiced assent   Patient consented for risks of anesthesia including but not limited to:  - adverse reactions to medications - damage to eyes, teeth, lips or other oral mucosa - nerve damage due to positioning  - sore throat or hoarseness - Damage to heart, brain, nerves, lungs, other parts of body or loss of life  Patient voiced understanding.)      Anesthesia Quick Evaluation

## 2022-09-24 NOTE — Anesthesia Postprocedure Evaluation (Signed)
Anesthesia Post Note  Patient: Benjamin Parker  Procedure(s) Performed: CATARACT EXTRACTION PHACO AND INTRAOCULAR LENS PLACEMENT (IOC) LEFT (Left: Eye)  Patient location during evaluation: Phase II Anesthesia Type: MAC Level of consciousness: awake and alert Pain management: pain level controlled Vital Signs Assessment: post-procedure vital signs reviewed and stable Respiratory status: spontaneous breathing, nonlabored ventilation, respiratory function stable and patient connected to nasal cannula oxygen Cardiovascular status: stable and blood pressure returned to baseline Postop Assessment: no apparent nausea or vomiting Anesthetic complications: no   No notable events documented.   Last Vitals:  Vitals:   09/24/22 1331 09/24/22 1335  BP: (!) 164/81 (!) 159/80  Pulse: 64 64  Resp: 14 14  Temp: 36.7 C 36.7 C  SpO2: 98% 98%    Last Pain:  Vitals:   09/24/22 1335  TempSrc:   PainSc: 0-No pain                 Precious Haws Anett Ranker

## 2022-09-24 NOTE — Op Note (Signed)
PREOPERATIVE DIAGNOSIS:  Nuclear sclerotic cataract of the left eye.   POSTOPERATIVE DIAGNOSIS:  Nuclear sclerotic cataract of the left eye.   OPERATIVE PROCEDURE:ORPROCALL@   SURGEON:  Birder Robson, MD.   ANESTHESIA:  Anesthesiologist: Piscitello, Precious Haws, MD CRNA: Jacqualin Combes, CRNA  1.      Managed anesthesia care. 2.     0.42m of Shugarcaine was instilled following the paracentesis   COMPLICATIONS:  None.   TECHNIQUE:   Stop and chop   DESCRIPTION OF PROCEDURE:  The patient was examined and consented in the preoperative holding area where the aforementioned topical anesthesia was applied to the left eye and then brought back to the Operating Room where the left eye was prepped and draped in the usual sterile ophthalmic fashion and a lid speculum was placed. A paracentesis was created with the side port blade and the anterior chamber was filled with viscoelastic. A near clear corneal incision was performed with the steel keratome. A continuous curvilinear capsulorrhexis was performed with a cystotome followed by the capsulorrhexis forceps. Hydrodissection and hydrodelineation were carried out with BSS on a blunt cannula. The lens was removed in a stop and chop  technique and the remaining cortical material was removed with the irrigation-aspiration handpiece. The capsular bag was inflated with viscoelastic and the Technis [id number] lens was placed in the capsular bag without complication. The remaining viscoelastic was removed from the eye with the irrigation-aspiration handpiece. The wounds were hydrated. The anterior chamber was flushed with BSS and the eye was inflated to physiologic pressure. 0.178mVigamox was placed in the anterior chamber. The wounds were found to be water tight. The eye was dressed with Combigan. The patient was given protective glasses to wear throughout the day and a shield with which to sleep tonight. The patient was also given drops with which to begin a drop  regimen today and will follow-up with me in one day. Implant Name Type Inv. Item Serial No. Manufacturer Lot No. LRB No. Used Action  LENS IOL ACRYSOF VIVITY 19.5 - [id number] IOL ACRYSOF VIVITY 19.5 [id number]  Left 1 Implanted    Procedure(s) with comments: CATARACT EXTRACTION PHACO AND INTRAOCULAR LENS PLACEMENT (IOC) LEFT (Left) - 6.87 00:43.3  Electronically signed: WiBirder Robson0/09/2022 1:30 PM

## 2022-09-25 ENCOUNTER — Encounter: Payer: Self-pay | Admitting: Ophthalmology

## 2024-12-17 ENCOUNTER — Telehealth: Payer: Self-pay

## 2024-12-17 NOTE — Telephone Encounter (Signed)
 Colonoscopy referral received for patient.  His last colonoscopy was performed by Dr. Therisa 08/22/22.    Next is due 08/22/25.  I will make note to call him back in July/Aug to schedule colonoscopy.  He is okay with another provider doing his procedure.  Thanks,  Rosaline CMA
